# Patient Record
Sex: Female | Born: 1960 | Race: Black or African American | Hispanic: No | Marital: Single | Smoking: Current every day smoker
Health system: Southern US, Community
[De-identification: ages and names within clinical notes are randomized; demographics above are authoritative.]

## PROBLEM LIST (undated history)

## (undated) DIAGNOSIS — M5020 Other cervical disc displacement, unspecified cervical region: Secondary | ICD-10-CM

## (undated) DIAGNOSIS — I10 Essential (primary) hypertension: Secondary | ICD-10-CM

## (undated) DIAGNOSIS — E119 Type 2 diabetes mellitus without complications: Secondary | ICD-10-CM

## (undated) DIAGNOSIS — H409 Unspecified glaucoma: Secondary | ICD-10-CM

## (undated) DIAGNOSIS — J45909 Unspecified asthma, uncomplicated: Secondary | ICD-10-CM

## (undated) DIAGNOSIS — F419 Anxiety disorder, unspecified: Secondary | ICD-10-CM

## (undated) HISTORY — PX: PARTIAL HYSTERECTOMY: SHX80

## (undated) HISTORY — PX: BILATERAL CARPAL TUNNEL RELEASE: SHX6508

## (undated) HISTORY — PX: NASAL SINUS SURGERY: SHX719

## (undated) HISTORY — PX: BLADDER SUSPENSION: SHX72

---

## 1998-08-08 ENCOUNTER — Encounter: Payer: Self-pay | Admitting: Emergency Medicine

## 1998-08-08 ENCOUNTER — Emergency Department (HOSPITAL_COMMUNITY): Admission: EM | Admit: 1998-08-08 | Discharge: 1998-08-08 | Payer: Self-pay | Admitting: Emergency Medicine

## 1999-02-13 ENCOUNTER — Emergency Department (HOSPITAL_COMMUNITY): Admission: EM | Admit: 1999-02-13 | Discharge: 1999-02-13 | Payer: Self-pay | Admitting: Emergency Medicine

## 1999-02-13 ENCOUNTER — Encounter: Payer: Self-pay | Admitting: Emergency Medicine

## 2014-01-31 ENCOUNTER — Encounter (INDEPENDENT_AMBULATORY_CARE_PROVIDER_SITE_OTHER): Payer: PRIVATE HEALTH INSURANCE | Admitting: Ophthalmology

## 2014-01-31 DIAGNOSIS — H2510 Age-related nuclear cataract, unspecified eye: Secondary | ICD-10-CM

## 2014-01-31 DIAGNOSIS — I10 Essential (primary) hypertension: Secondary | ICD-10-CM

## 2014-01-31 DIAGNOSIS — H43819 Vitreous degeneration, unspecified eye: Secondary | ICD-10-CM

## 2014-01-31 DIAGNOSIS — H35419 Lattice degeneration of retina, unspecified eye: Secondary | ICD-10-CM

## 2014-01-31 DIAGNOSIS — H35039 Hypertensive retinopathy, unspecified eye: Secondary | ICD-10-CM

## 2016-08-24 ENCOUNTER — Emergency Department (HOSPITAL_COMMUNITY)
Admission: EM | Admit: 2016-08-24 | Discharge: 2016-08-24 | Disposition: A | Discharge status: Home / Self Care | Payer: Medicare Other | Attending: Emergency Medicine | Admitting: Emergency Medicine

## 2016-08-24 ENCOUNTER — Encounter (HOSPITAL_COMMUNITY): Payer: Self-pay | Admitting: Emergency Medicine

## 2016-08-24 DIAGNOSIS — M5440 Lumbago with sciatica, unspecified side: Secondary | ICD-10-CM

## 2016-08-24 DIAGNOSIS — G8929 Other chronic pain: Secondary | ICD-10-CM | POA: Diagnosis not present

## 2016-08-24 DIAGNOSIS — Z87891 Personal history of nicotine dependence: Secondary | ICD-10-CM | POA: Diagnosis not present

## 2016-08-24 DIAGNOSIS — M545 Low back pain: Secondary | ICD-10-CM | POA: Diagnosis present

## 2016-08-24 HISTORY — DX: Anxiety disorder, unspecified: F41.9

## 2016-08-24 HISTORY — DX: Unspecified glaucoma: H40.9

## 2016-08-24 MED ORDER — LIDOCAINE 5 % EX PTCH: 1.0000 | MEDICATED_PATCH | CUTANEOUS | 0 refills | Status: DC

## 2016-08-24 MED ORDER — CYCLOBENZAPRINE HCL 10 MG PO TABS: 10.0000 mg | ORAL_TABLET | Freq: Two times a day (BID) | ORAL | 0 refills | Status: DC | PRN

## 2016-08-24 MED ORDER — OXYCODONE-ACETAMINOPHEN 5-325 MG PO TABS: 2.0000 | ORAL_TABLET | Freq: Once | ORAL | Status: DC

## 2016-08-24 MED ORDER — HYDROMORPHONE HCL 1 MG/ML IJ SOLN
1.0000 mg | Freq: Once | INTRAMUSCULAR | Status: AC
  Administered 2016-08-24: 1 mg via INTRAVENOUS
  Filled 2016-08-24: qty 1

## 2016-08-24 MED ORDER — PREDNISONE 10 MG PO TABS: 20.0000 mg | ORAL_TABLET | Freq: Two times a day (BID) | ORAL | 0 refills | Status: DC

## 2016-08-24 MED ORDER — KETOROLAC TROMETHAMINE 60 MG/2ML IM SOLN: 60.0000 mg | Freq: Once | INTRAMUSCULAR | Status: DC

## 2016-08-24 MED ORDER — KETOROLAC TROMETHAMINE 30 MG/ML IJ SOLN
30.0000 mg | Freq: Once | INTRAMUSCULAR | Status: AC
  Administered 2016-08-24: 30 mg via INTRAVENOUS
  Filled 2016-08-24: qty 1

## 2016-08-24 MED ORDER — OXYCODONE-ACETAMINOPHEN 5-325 MG PO TABS: 1.0000 | ORAL_TABLET | Freq: Four times a day (QID) | ORAL | 0 refills | Status: DC | PRN

## 2016-08-24 MED ORDER — METHOCARBAMOL 500 MG PO TABS
1000.0000 mg | ORAL_TABLET | Freq: Once | ORAL | Status: AC
  Administered 2016-08-24: 1000 mg via ORAL
  Filled 2016-08-24: qty 2

## 2016-08-24 NOTE — ED Notes (Signed)
Pt. Walked tot he bathroom and is able to ambulate

## 2016-08-24 NOTE — ED Notes (Signed)
Papers reviewed, IV removed and pt verbalizes understanding Leaving ambulatory with husband with decreased pain

## 2016-08-24 NOTE — ED Provider Notes (Signed)
[facility]PT Provider Note   CSN: [id number] Arrival date & time: 08/24/16  0940     History   Chief Complaint Chief Complaint  Patient presents with  . Back Pain    HPI Sara Juarez is a 55 y.o. female.   HPI   Sara Juarez is a 55 y.o. female, with a history of Chronic back pain and L4-L5 herniated disc, presenting to the ED with acute on chronic lower back pain worsening over the past 2-3 days. Pain is severe, shooting down both legs. Endorses worsening numbness and weakness bilaterally in the lower extremities. Pt was using hydrocodone/APAP 10/325mg  every 4 hours for her back pain up until 3 weeks ago. States she has been seen by a neurosurgeon for her back injury, but was told that the surgery would be risky and they couldn't guarantee she would be able to walk afterward.  Denies fever/chills, changes in bowel or bladder function, nausea/vomiting, abdominal pain, falls/trauma, or any other complaints. Patient also denies history of HIV, IV drug use, or epidural procedures.     Past Medical History:  Diagnosis Date  . Anxiety   . Glaucoma (increased eye pressure)     There are no active problems to display for this patient.   History reviewed. No pertinent surgical history.  OB History    No data available       Home Medications    Prior to Admission medications   Medication Sig Start Date End Date Taking? Authorizing Provider  ALPRAZolam [name]) 0.5 MG tablet Take 0.5 mg by mouth 2 (two) times daily as needed for anxiety. 08/07/16  Yes Historical Provider, MD  fluticasone (FLONASE) 50 MCG/ACT nasal spray Place 1 spray into both nostrils daily as needed for allergies. 06/16/16  Yes Historical Provider, MD  HYDROcodone-acetaminophen (NORCO) 10-325 MG tablet Take 1 tablet by mouth every 6 (six) hours as needed for pain. 07/08/16  Yes Historical Provider, MD  lisinopril (PRINIVIL,ZESTRIL) 10 MG tablet Take 10 mg by mouth daily. 08/07/16  Yes  Historical Provider, MD  loratadine (CLARITIN) 10 MG tablet Take 10 mg by mouth daily. 06/03/16  Yes Historical Provider, MD  LUMIGAN 0.01 % SOLN Place 1 drop into both eyes every evening. 06/04/16  Yes Historical Provider, MD  omeprazole (PRILOSEC) 20 MG capsule Take 20 mg by mouth daily as needed for allergies. 07/08/16  Yes Historical Provider, MD  PAZEO 0.7 % SOLN Place 1 drop into both eyes daily. 06/16/16  Yes Historical Provider, MD  Polyethyl Glycol-Propyl Glycol (SYSTANE OP) Place 1 drop into both eyes daily as needed (dry eyes).   Yes Historical Provider, MD  PROAIR HFA 108 [phone] Base) MCG/ACT inhaler Inhale 1 puff into the lungs every 6 (six) hours as needed for wheezing. 06/08/16  Yes Historical Provider, MD  tiZANidine (ZANAFLEX) 4 MG tablet Take 4 mg by mouth 3 (three) times daily as needed for muscle spasms. 07/12/16  Yes Historical Provider, MD  zafirlukast (ACCOLATE) 20 MG tablet Take 20 mg by mouth every evening. 06/16/16  Yes Historical Provider, MD  cyclobenzaprine (FLEXERIL) 10 MG tablet Take 1 tablet (10 mg total) by mouth 2 (two) times daily as needed for muscle spasms. 08/24/16   Yennifer Segovia C Chelse Matas, PA-C  lidocaine (LIDODERM) 5 % Place 1 patch onto the skin daily. Remove & Discard patch within 12 hours or as directed by MD 08/24/16   Anselm Pancoast, PA-C  oxyCODONE-acetaminophen (PERCOCET/ROXICET) 5-325 MG tablet Take 1-2 tablets by mouth every 6 (six)  hours as needed for severe pain. 08/24/16   Philmore Lepore C Alyanah Elliott, PA-C  predniSONE (DELTASONE) 10 MG tablet Take 2 tablets (20 mg total) by mouth 2 (two) times daily with a meal. 08/24/16   Randi Poullard C Tamikka Pilger, PA-C    Family History No family history on file.  Social History Social History  Substance Use Topics  . Smoking status: Former Games developermoker  . Smokeless tobacco: Never Used  . Alcohol use Not on file     Allergies   Erythromycin and Aspirin   Review of Systems Review of Systems  Constitutional: Negative for chills and fever.  Musculoskeletal: Positive  for back pain.  Neurological: Positive for numbness (chronic). Negative for weakness.  All other systems reviewed and are negative.    Physical Exam Updated Vital Signs BP 145/78 (BP Location: Right Arm)   Pulse 86   Temp 98.4 F (36.9 C) (Oral)   Resp 20   SpO2 100%   Physical Exam  Constitutional: She is oriented to person, place, and time. She appears well-developed and well-nourished. No distress.  HENT:  Head: Normocephalic and atraumatic.  Eyes: Conjunctivae are normal.  Neck: Neck supple.  Cardiovascular: Normal rate, regular rhythm, normal heart sounds and intact distal pulses.   Pulmonary/Chest: Effort normal and breath sounds normal. No respiratory distress.  Abdominal: Soft. There is no tenderness. There is no guarding.  Musculoskeletal: She exhibits no edema or tenderness.  Midline lumbar spinal tenderness without step off or deformity. Full ROM in all extremities and spine. No other midline spinal tenderness.   Lymphadenopathy:    She has no cervical adenopathy.  Neurological: She is alert and oriented to person, place, and time. She has normal reflexes.  Chronic decreased sensation in the left foot. Strength initially rated 4 out of 5 bilaterally in the lower extremities, however, after the patient's pain was controlled, her strength is noted to be 5 out of 5 bilaterally in both the upper and lower extremities. Coordination intact. No gait deficit.  Skin: Skin is warm and dry. She is not diaphoretic.  Psychiatric: She has a normal mood and affect. Her behavior is normal.  Nursing note and vitals reviewed.    ED Treatments / Results  Labs (all labs ordered are listed, but only abnormal results are displayed) Labs Reviewed - No data to display  EKG  EKG Interpretation None       Radiology No results found.  Procedures Procedures (including critical care time)  Angiocath insertion Performed by: Anselm PancoastShawn C Laconda Basich  Consent: Verbal consent obtained. Risks  and benefits: risks, benefits and alternatives were discussed Time out: Immediately prior to procedure a "time out" was called to verify the correct patient, procedure, equipment, support staff and site/side marked as required.  Preparation: Patient was prepped and draped in the usual sterile fashion.  Vein Location: Right forearm  Not Ultrasound Guided  Gauge: 20-gauge  Normal blood return and flush without difficulty Patient tolerance: Patient tolerated the procedure well with no immediate complications.    Medications Ordered in ED Medications  methocarbamol (ROBAXIN) tablet 1,000 mg (1,000 mg Oral Given 08/24/16 1023)  HYDROmorphone (DILAUDID) injection 1 mg (1 mg Intravenous Given 08/24/16 1024)  ketorolac (TORADOL) 30 MG/ML injection 30 mg (30 mg Intravenous Given 08/24/16 1024)     Initial Impression / Assessment and Plan / ED Course  I have reviewed the triage vital signs and the nursing notes.  Pertinent labs & imaging results that were available during my care of the patient were  reviewed by me and considered in my medical decision making (see chart for details).  Clinical Course    Marlaysia A Smola presents with midline lower back pain radiating down the legs worsening over the last 3 days.  Findings and plan of care discussed with Melene Plan, DO.   Patient was some decreased function, likely due to pain. This was proven once the patient's pain was well controlled. Patient is ambulatory without assistance and able to urinate without difficulty. Neurosurgery follow-up. The patient was given instructions for home care as well as return precautions. Patient voices understanding of these instructions, accepts the plan, and is comfortable with discharge.   Vitals:   08/24/16 1115 08/24/16 1130 08/24/16 1145 08/24/16 1200  BP: 125/59 133/86 143/76 149/96  Pulse: 71 69 71 70  Resp:      Temp:      TempSrc:      SpO2: 96% 96% 94% 95%     Final Clinical Impressions(s)  / ED Diagnoses   Final diagnoses:  Midline low back pain with sciatica, sciatica laterality unspecified    New Prescriptions New Prescriptions   CYCLOBENZAPRINE (FLEXERIL) 10 MG TABLET    Take 1 tablet (10 mg total) by mouth 2 (two) times daily as needed for muscle spasms.   LIDOCAINE (LIDODERM) 5 %    Place 1 patch onto the skin daily. Remove & Discard patch within 12 hours or as directed by MD   OXYCODONE-ACETAMINOPHEN (PERCOCET/ROXICET) 5-325 MG TABLET    Take 1-2 tablets by mouth every 6 (six) hours as needed for severe pain.   PREDNISONE (DELTASONE) 10 MG TABLET    Take 2 tablets (20 mg total) by mouth 2 (two) times daily with a meal.     Anselm Pancoast, PA-C 08/24/16 1228    Melene Plan, DO 08/24/16 1229

## 2016-08-24 NOTE — ED Triage Notes (Signed)
Back pain for a long time and this am pt states it got so bad that ir shoots down both legs sharp pain no numbness or tingling, pt tearful states hurts to move

## 2016-08-24 NOTE — Discharge Instructions (Signed)
Take it easy, but do not lay around too much as this may make the stiffness worse. Take 500 mg of naproxen every 12 hours or 800 mg of ibuprofen every 8 hours for the next 3 days. Take these medications with food to avoid upset stomach. Flexeril is a muscle relaxer and may help loosen stiff muscles. Percocet for severe pain. Do not take the Flexeril or Percocet while driving or performing other dangerous activities.  Follow-up with the neurosurgeon as soon as possible. Call the number provided to set up an appointment.

## 2016-08-24 NOTE — ED Notes (Addendum)
L4, L5 herniated and bulging L shoulder blade has pinched nerve  Abnormal deteriation  in lower lumbar spine   Prescribed hydrocodone for pain at home

## 2016-09-07 ENCOUNTER — Emergency Department (HOSPITAL_COMMUNITY)
Admission: EM | Admit: 2016-09-07 | Discharge: 2016-09-07 | Disposition: A | Discharge status: Home / Self Care | Payer: Medicare Other | Attending: Emergency Medicine | Admitting: Emergency Medicine

## 2016-09-07 ENCOUNTER — Encounter (HOSPITAL_COMMUNITY): Payer: Self-pay | Admitting: Emergency Medicine

## 2016-09-07 DIAGNOSIS — Z87891 Personal history of nicotine dependence: Secondary | ICD-10-CM | POA: Insufficient documentation

## 2016-09-07 DIAGNOSIS — M62838 Other muscle spasm: Secondary | ICD-10-CM | POA: Diagnosis present

## 2016-09-07 DIAGNOSIS — M62830 Muscle spasm of back: Secondary | ICD-10-CM | POA: Diagnosis not present

## 2016-09-07 HISTORY — DX: Other cervical disc displacement, unspecified cervical region: M50.20

## 2016-09-07 MED ORDER — CYCLOBENZAPRINE HCL 10 MG PO TABS: 10.0000 mg | ORAL_TABLET | Freq: Two times a day (BID) | ORAL | 0 refills | Status: DC | PRN

## 2016-09-07 MED ORDER — DICLOFENAC SODIUM 1 % TD GEL: 2.0000 g | Freq: Four times a day (QID) | TRANSDERMAL | 0 refills | Status: DC

## 2016-09-07 NOTE — ED Triage Notes (Signed)
States she was getting stuff out of storage unit and has had lots of hand/wrist surgeries and overused them. Having spasms in back, leg, hands; would usually take flexeril and would feel better but out of them.

## 2016-09-07 NOTE — ED Notes (Signed)
Patient ambulated to restroom without difficulty.

## 2016-09-07 NOTE — ED Notes (Signed)
C/o "pain all over...for a while" States she is out of Flexeril. Also, c/o right wrist pain since "getting in storage building" this am.

## 2016-09-07 NOTE — ED Provider Notes (Signed)
[facility] Provider Note   CSN: [id number] Arrival date & time: 09/07/16  1233   By signing my name below, I, Freida Busmaniana Omoyeni, attest that this documentation has been prepared under the direction and in the presence of non-physician practitioner, Gaylyn RongSamantha Dowless, PA-C. Electronically Signed: Freida Busmaniana Omoyeni, Scribe. 09/07/2016. 2:09 PM.   History   Chief Complaint Chief Complaint  Patient presents with  . Spasms   The history is provided by the patient. No language interpreter was used.     HPI Comments:  Sara Juarez is a 55 y.o. female who presents to the Emergency Department complaining of muscle spasms in her BUE, back, and inner thighs bilaterally. Pt notes h/o same secondary to pinched nerve in back but states she is usually able to alleviate her pain by taking Flexeril which she ran out of  2 days ago. Pt notes associated tingling in her hands today. She has been able to ambulate. She denies bowel/bladder incontinence. She has not yet been able to follow up with neurosurgery.  Past Medical History:  Diagnosis Date  . Anxiety   . Cervical herniated disc   . Glaucoma (increased eye pressure)     There are no active problems to display for this patient.   No past surgical history on file.  OB History    No data available       Home Medications    Prior to Admission medications   Medication Sig Start Date End Date Taking? Authorizing Provider  ALPRAZolam [name](XANAX) 0.5 MG tablet Take 0.5 mg by mouth 2 (two) times daily as needed for anxiety. 08/07/16   Historical Provider, MD  cyclobenzaprine (FLEXERIL) 10 MG tablet Take 1 tablet (10 mg total) by mouth 2 (two) times daily as needed for muscle spasms. 08/24/16   [name], PA-C  fluticasone (FLONASE) 50 MCG/ACT nasal spray Place 1 spray into both nostrils daily as needed for allergies. 06/16/16   Historical Provider, MD  HYDROcodone-acetaminophen (NORCO) 10-325 MG tablet Take 1 tablet by mouth every 6 (six)  hours as needed for pain. 07/08/16   Historical Provider, MD  lidocaine (LIDODERM) 5 % Place 1 patch onto the skin daily. Remove & Discard patch within 12 hours or as directed by MD 08/24/16   Hillard DankerShawn C Joy, PA-C  lisinopril (PRINIVIL,ZESTRIL) 10 MG tablet Take 10 mg by mouth daily. 08/07/16   Historical Provider, MD  loratadine (CLARITIN) 10 MG tablet Take 10 mg by mouth daily. 06/03/16   Historical Provider, MD  LUMIGAN 0.01 % SOLN Place 1 drop into both eyes every evening. 06/04/16   Historical Provider, MD  omeprazole (PRILOSEC) 20 MG capsule Take 20 mg by mouth daily as needed for allergies. 07/08/16   Historical Provider, MD  oxyCODONE-acetaminophen (PERCOCET/ROXICET) 5-325 MG tablet Take 1-2 tablets by mouth every 6 (six) hours as needed for severe pain. 08/24/16   [name], PA-C  PAZEO 0.7 % SOLN Place 1 drop into both eyes daily. 06/16/16   Historical Provider, MD  Polyethyl Glycol-Propyl Glycol (SYSTANE OP) Place 1 drop into both eyes daily as needed (dry eyes).    Historical Provider, MD  predniSONE (DELTASONE) 10 MG tablet Take 2 tablets (20 mg total) by mouth 2 (two) times daily with a meal. 08/24/16   [name], PA-C  PROAIR HFA 108 (90 Base) MCG/ACT inhaler Inhale 1 puff into the lungs every 6 (six) hours as needed for wheezing. 06/08/16   Historical Provider, MD  tiZANidine (ZANAFLEX) 4 MG tablet  Take 4 mg by mouth 3 (three) times daily as needed for muscle spasms. 07/12/16   Historical Provider, MD  zafirlukast (ACCOLATE) 20 MG tablet Take 20 mg by mouth every evening. 06/16/16   Historical Provider, MD    Family History No family history on file.  Social History Social History  Substance Use Topics  . Smoking status: Former Games developer  . Smokeless tobacco: Never Used  . Alcohol use Not on file     Allergies   Erythromycin and Aspirin   Review of Systems Review of Systems 10 systems reviewed and all are negative for acute change except as noted in the HPI.   Physical  Exam Updated Vital Signs BP (!) 140/101 (BP Location: Right Arm)   Pulse 98   Temp 98.6 F (37 C) (Oral)   Resp 22   SpO2 96%   Physical Exam  Constitutional: She is oriented to person, place, and time. She appears well-developed and well-nourished. No distress.  HENT:  Head: Normocephalic and atraumatic.  Eyes: Conjunctivae are normal. Right eye exhibits no discharge. Left eye exhibits no discharge. No scleral icterus.  Cardiovascular: Normal rate.   Pulmonary/Chest: Effort normal.  Musculoskeletal:       Thoracic back: She exhibits spasm. She exhibits no bony tenderness and no deformity.       Back:  Neurological: She is alert and oriented to person, place, and time. Coordination normal.  Skin: Skin is warm and dry. No rash noted. She is not diaphoretic. No erythema. No pallor.  Psychiatric: She has a normal mood and affect. Her behavior is normal.  Nursing note and vitals reviewed.    ED Treatments / Results  DIAGNOSTIC STUDIES:  Oxygen Saturation is 96% on RA, normal by my interpretation.    COORDINATION OF CARE:  2:13 PM Discussed treatment plan with pt at bedside and pt agreed to plan.  Labs (all labs ordered are listed, but only abnormal results are displayed) Labs Reviewed - No data to display  EKG  EKG Interpretation None       Radiology No results found.  Procedures Procedures (including critical care time)  Medications Ordered in ED Medications - No data to display   Initial Impression / Assessment and Plan / ED Course  I have reviewed the triage vital signs and the nursing notes.  Pertinent labs & imaging results that were available during my care of the patient were reviewed by me and considered in my medical decision making (see chart for details).  Clinical Course    55 year old female with history of chronic back pain, "pinched nerve in back", presents to the ED today with complaint of ongoing muscle spasms. Patient has been having  similar symptoms for many years and is typically controlled with Flexeril. However, patient ran out of her medication 2 days ago. No neurological deficits on exam. Patient is ambulatory in ED without difficulty. Patient has scheduled follow-up with neurosurgery. We'll provide refill her Flexeril and Voltaren gel. Return precautions outlined in patient discharge instructions.  Final Clinical Impressions(s) / ED Diagnoses   Final diagnoses:  Muscle spasm of back    New Prescriptions Discharge Medication List as of 09/07/2016  2:30 PM    START taking these medications   Details  !! cyclobenzaprine (FLEXERIL) 10 MG tablet Take 1 tablet (10 mg total) by mouth 2 (two) times daily as needed for muscle spasms., Starting Tue 09/07/2016, Print    diclofenac sodium (VOLTAREN) 1 % GEL Apply 2 g topically 4 (four)  times daily., Starting Tue 09/07/2016, Print     !! - Potential duplicate medications found. Please discuss with provider.     I personally performed the services described in this documentation, which was scribed in my presence. The recorded information has been reviewed and is accurate.      Lester Kinsman [place], PA-C 09/07/16 1627    Cathren Laine, MD 09/08/16 1340

## 2016-09-28 ENCOUNTER — Emergency Department (HOSPITAL_COMMUNITY)
Admission: EM | Admit: 2016-09-28 | Discharge: 2016-09-28 | Disposition: A | Discharge status: Home / Self Care | Payer: Medicare Other | Attending: Emergency Medicine | Admitting: Emergency Medicine

## 2016-09-28 ENCOUNTER — Emergency Department (HOSPITAL_COMMUNITY): Payer: Medicare Other

## 2016-09-28 ENCOUNTER — Encounter (HOSPITAL_COMMUNITY): Payer: Self-pay | Admitting: Emergency Medicine

## 2016-09-28 DIAGNOSIS — S6991XA Unspecified injury of right wrist, hand and finger(s), initial encounter: Secondary | ICD-10-CM | POA: Diagnosis present

## 2016-09-28 DIAGNOSIS — S6391XA Sprain of unspecified part of right wrist and hand, initial encounter: Secondary | ICD-10-CM | POA: Insufficient documentation

## 2016-09-28 DIAGNOSIS — F17200 Nicotine dependence, unspecified, uncomplicated: Secondary | ICD-10-CM | POA: Diagnosis not present

## 2016-09-28 DIAGNOSIS — Y939 Activity, unspecified: Secondary | ICD-10-CM | POA: Diagnosis not present

## 2016-09-28 DIAGNOSIS — X58XXXA Exposure to other specified factors, initial encounter: Secondary | ICD-10-CM | POA: Insufficient documentation

## 2016-09-28 DIAGNOSIS — S63501A Unspecified sprain of right wrist, initial encounter: Secondary | ICD-10-CM

## 2016-09-28 DIAGNOSIS — Y999 Unspecified external cause status: Secondary | ICD-10-CM | POA: Insufficient documentation

## 2016-09-28 DIAGNOSIS — Y929 Unspecified place or not applicable: Secondary | ICD-10-CM | POA: Diagnosis not present

## 2016-09-28 NOTE — ED Notes (Signed)
Pt called several times with no answer 
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
## 2016-09-28 NOTE — ED Notes (Signed)
See providers assessment.  

## 2016-09-28 NOTE — ED Notes (Signed)
Patient never returned to the ED nor answered any of the calls for their name.  Patient moved off the floor at this time
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
## 2016-09-28 NOTE — ED Notes (Signed)
Patient walked back to the desk and she will be seen now

## 2017-01-05 ENCOUNTER — Emergency Department (HOSPITAL_COMMUNITY)
Admission: EM | Admit: 2017-01-05 | Discharge: 2017-01-05 | Disposition: A | Discharge status: Home / Self Care | Payer: Medicare Other | Attending: Emergency Medicine | Admitting: Emergency Medicine

## 2017-01-05 ENCOUNTER — Emergency Department (HOSPITAL_COMMUNITY): Payer: Medicare Other

## 2017-01-05 ENCOUNTER — Encounter (HOSPITAL_COMMUNITY): Payer: Self-pay

## 2017-01-05 DIAGNOSIS — Z79899 Other long term (current) drug therapy: Secondary | ICD-10-CM | POA: Diagnosis not present

## 2017-01-05 DIAGNOSIS — F17200 Nicotine dependence, unspecified, uncomplicated: Secondary | ICD-10-CM | POA: Insufficient documentation

## 2017-01-05 DIAGNOSIS — Y999 Unspecified external cause status: Secondary | ICD-10-CM | POA: Diagnosis not present

## 2017-01-05 DIAGNOSIS — Y939 Activity, unspecified: Secondary | ICD-10-CM | POA: Diagnosis not present

## 2017-01-05 DIAGNOSIS — G894 Chronic pain syndrome: Secondary | ICD-10-CM | POA: Diagnosis not present

## 2017-01-05 DIAGNOSIS — S161XXA Strain of muscle, fascia and tendon at neck level, initial encounter: Secondary | ICD-10-CM | POA: Insufficient documentation

## 2017-01-05 DIAGNOSIS — Y92410 Unspecified street and highway as the place of occurrence of the external cause: Secondary | ICD-10-CM | POA: Diagnosis not present

## 2017-01-05 DIAGNOSIS — S199XXA Unspecified injury of neck, initial encounter: Secondary | ICD-10-CM | POA: Diagnosis present

## 2017-01-05 MED ORDER — HYDROCODONE-ACETAMINOPHEN 5-325 MG PO TABS
2.0000 | ORAL_TABLET | Freq: Once | ORAL | Status: AC
  Administered 2017-01-05: 2 via ORAL
  Filled 2017-01-05: qty 2

## 2017-01-05 MED ORDER — CYCLOBENZAPRINE HCL 10 MG PO TABS
10.0000 mg | ORAL_TABLET | Freq: Once | ORAL | Status: AC
  Administered 2017-01-05: 10 mg via ORAL
  Filled 2017-01-05: qty 1

## 2017-01-05 NOTE — ED Notes (Addendum)
Verbalized understanding discharge instructions and follow-up. In no acute distress.  Pt informed to take home medications as prescribed.
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
## 2017-01-05 NOTE — Discharge Instructions (Signed)
Take muscle relaxer at bedtime to help you sleep. This medicine makes you drowsy so do not take before driving or work Continue your home pain medicine regimen and follow up with Dr. Clovis RileyMitchell as needed Use a heating pad for sore muscles - use for 20 minutes several times a day

## 2017-02-21 ENCOUNTER — Emergency Department (HOSPITAL_COMMUNITY): Payer: Medicare Other

## 2017-02-21 ENCOUNTER — Inpatient Hospital Stay (HOSPITAL_COMMUNITY)
Admission: EM | Admit: 2017-02-21 | Discharge: 2017-02-23 | DRG: 872 | Disposition: A | Discharge status: Home / Self Care | Payer: Medicare Other | Attending: Internal Medicine | Admitting: Internal Medicine

## 2017-02-21 ENCOUNTER — Encounter (HOSPITAL_COMMUNITY): Payer: Self-pay | Admitting: *Deleted

## 2017-02-21 DIAGNOSIS — I10 Essential (primary) hypertension: Secondary | ICD-10-CM

## 2017-02-21 DIAGNOSIS — Z7984 Long term (current) use of oral hypoglycemic drugs: Secondary | ICD-10-CM

## 2017-02-21 DIAGNOSIS — N898 Other specified noninflammatory disorders of vagina: Secondary | ICD-10-CM | POA: Diagnosis not present

## 2017-02-21 DIAGNOSIS — R102 Pelvic and perineal pain: Secondary | ICD-10-CM | POA: Diagnosis not present

## 2017-02-21 DIAGNOSIS — E1159 Type 2 diabetes mellitus with other circulatory complications: Secondary | ICD-10-CM | POA: Diagnosis present

## 2017-02-21 DIAGNOSIS — H409 Unspecified glaucoma: Secondary | ICD-10-CM | POA: Diagnosis present

## 2017-02-21 DIAGNOSIS — Z7951 Long term (current) use of inhaled steroids: Secondary | ICD-10-CM

## 2017-02-21 DIAGNOSIS — R000 Tachycardia, unspecified: Secondary | ICD-10-CM | POA: Diagnosis present

## 2017-02-21 DIAGNOSIS — A419 Sepsis, unspecified organism: Principal | ICD-10-CM | POA: Diagnosis present

## 2017-02-21 DIAGNOSIS — E119 Type 2 diabetes mellitus without complications: Secondary | ICD-10-CM | POA: Diagnosis not present

## 2017-02-21 DIAGNOSIS — N12 Tubulo-interstitial nephritis, not specified as acute or chronic: Secondary | ICD-10-CM | POA: Diagnosis present

## 2017-02-21 DIAGNOSIS — B9620 Unspecified Escherichia coli [E. coli] as the cause of diseases classified elsewhere: Secondary | ICD-10-CM | POA: Diagnosis present

## 2017-02-21 DIAGNOSIS — Z6835 Body mass index (BMI) 35.0-35.9, adult: Secondary | ICD-10-CM

## 2017-02-21 DIAGNOSIS — Z79899 Other long term (current) drug therapy: Secondary | ICD-10-CM

## 2017-02-21 DIAGNOSIS — Z888 Allergy status to other drugs, medicaments and biological substances status: Secondary | ICD-10-CM

## 2017-02-21 DIAGNOSIS — F17200 Nicotine dependence, unspecified, uncomplicated: Secondary | ICD-10-CM | POA: Diagnosis present

## 2017-02-21 HISTORY — DX: Essential (primary) hypertension: I10

## 2017-02-21 HISTORY — DX: Tubulo-interstitial nephritis, not specified as acute or chronic: N12

## 2017-02-21 HISTORY — DX: Type 2 diabetes mellitus without complications: E11.9

## 2017-02-21 LAB — URINALYSIS, ROUTINE W REFLEX MICROSCOPIC
BILIRUBIN URINE: NEGATIVE
Glucose, UA: >=500 mg/dL — AB
Ketones, ur: NEGATIVE mg/dL
NITRITE: NEGATIVE
PROTEIN: 100 mg/dL — AB
SPECIFIC GRAVITY, URINE: 1.023 (ref 1.005–1.030)
pH: 6 (ref 5.0–8.0)

## 2017-02-21 LAB — COMPREHENSIVE METABOLIC PANEL
ALBUMIN: 3.9 g/dL (ref 3.5–5.0)
ALK PHOS: 68 U/L (ref 38–126)
ALT: 23 U/L (ref 14–54)
ANION GAP: 12 (ref 5–15)
AST: 19 U/L (ref 15–41)
BUN: 9 mg/dL (ref 6–20)
CALCIUM: 9.6 mg/dL (ref 8.9–10.3)
CHLORIDE: 96 mmol/L — AB (ref 101–111)
CO2: 26 mmol/L (ref 22–32)
CREATININE: 0.87 mg/dL (ref 0.44–1.00)
GFR calc Af Amer: >60 mL/min (ref >60)
GFR calc non Af Amer: >60 mL/min (ref >60)
GLUCOSE: 384 mg/dL — AB (ref 65–99)
Potassium: 4.1 mmol/L (ref 3.5–5.1)
SODIUM: 134 mmol/L — AB (ref 135–145)
Total Bilirubin: 0.7 mg/dL (ref 0.3–1.2)
Total Protein: 8.1 g/dL (ref 6.5–8.1)

## 2017-02-21 LAB — CBC WITH DIFFERENTIAL/PLATELET
BASOS ABS: 0 10*3/uL (ref 0.0–0.1)
Basophils Relative: 0 %
EOS PCT: 1 %
Eosinophils Absolute: 0.1 10*3/uL (ref 0.0–0.7)
HCT: 46.7 % — ABNORMAL HIGH (ref 36.0–46.0)
HEMOGLOBIN: 15.9 g/dL — AB (ref 12.0–15.0)
LYMPHS PCT: 23 %
Lymphs Abs: 2.6 10*3/uL (ref 0.7–4.0)
MCH: 28.8 pg (ref 26.0–34.0)
MCHC: 34 g/dL (ref 30.0–36.0)
MCV: 84.6 fL (ref 78.0–100.0)
MONOS PCT: 9 %
Monocytes Absolute: 1 10*3/uL (ref 0.1–1.0)
NEUTROS PCT: 67 %
Neutro Abs: 7.8 10*3/uL — ABNORMAL HIGH (ref 1.7–7.7)
Platelets: 312 10*3/uL (ref 150–400)
RBC: 5.52 MIL/uL — AB (ref 3.87–5.11)
RDW: 13.2 % (ref 11.5–15.5)
WBC: 11.5 10*3/uL — AB (ref 4.0–10.5)

## 2017-02-21 LAB — WET PREP, GENITAL
Clue Cells Wet Prep HPF POC: NONE SEEN
SPERM: NONE SEEN
TRICH WET PREP: NONE SEEN
YEAST WET PREP: NONE SEEN

## 2017-02-21 LAB — RAPID HIV SCREEN (HIV 1/2 AB+AG)
HIV 1/2 Antibodies: NONREACTIVE
HIV-1 P24 Antigen - HIV24: NONREACTIVE

## 2017-02-21 LAB — I-STAT CG4 LACTIC ACID, ED
LACTIC ACID, VENOUS: 2.9 mmol/L — AB (ref 0.5–1.9)
Lactic Acid, Venous: 2.76 mmol/L (ref 0.5–1.9)

## 2017-02-21 MED ORDER — PIPERACILLIN-TAZOBACTAM 3.375 G IVPB 30 MIN
3.3750 g | Freq: Once | INTRAVENOUS | Status: AC
  Administered 2017-02-21: 3.375 g via INTRAVENOUS
  Filled 2017-02-21: qty 50

## 2017-02-21 MED ORDER — INSULIN ASPART 100 UNIT/ML ~~LOC~~ SOLN
0.0000 [IU] | Freq: Three times a day (TID) | SUBCUTANEOUS | Status: DC
  Administered 2017-02-22 (×2): 8 [IU] via SUBCUTANEOUS
  Administered 2017-02-22: 11 [IU] via SUBCUTANEOUS
  Administered 2017-02-23: 15 [IU] via SUBCUTANEOUS

## 2017-02-21 MED ORDER — SODIUM CHLORIDE 0.9 % IV BOLUS (SEPSIS)
1000.0000 mL | Freq: Once | INTRAVENOUS | Status: AC
  Administered 2017-02-21: 1000 mL via INTRAVENOUS

## 2017-02-21 MED ORDER — ACETAMINOPHEN 325 MG PO TABS
650.0000 mg | ORAL_TABLET | Freq: Once | ORAL | Status: AC
  Administered 2017-02-21: 650 mg via ORAL

## 2017-02-21 MED ORDER — ENOXAPARIN SODIUM 40 MG/0.4ML ~~LOC~~ SOLN
40.0000 mg | Freq: Every day | SUBCUTANEOUS | Status: DC
  Administered 2017-02-22 – 2017-02-23 (×2): 40 mg via SUBCUTANEOUS
  Filled 2017-02-21 (×2): qty 0.4

## 2017-02-21 MED ORDER — ALPRAZOLAM 0.25 MG PO TABS
0.2500 mg | ORAL_TABLET | Freq: Two times a day (BID) | ORAL | Status: DC | PRN
  Administered 2017-02-22: 0.5 mg via ORAL
  Filled 2017-02-21 (×2): qty 2

## 2017-02-21 MED ORDER — CYCLOBENZAPRINE HCL 10 MG PO TABS
10.0000 mg | ORAL_TABLET | Freq: Two times a day (BID) | ORAL | Status: DC | PRN
  Administered 2017-02-22 – 2017-02-23 (×3): 10 mg via ORAL
  Filled 2017-02-21 (×3): qty 1

## 2017-02-21 MED ORDER — FLUTICASONE PROPIONATE 50 MCG/ACT NA SUSP: 1.0000 | Freq: Every day | NASAL | Status: DC | PRN

## 2017-02-21 MED ORDER — LISINOPRIL 20 MG PO TABS
20.0000 mg | ORAL_TABLET | Freq: Every day | ORAL | Status: DC
  Administered 2017-02-22 – 2017-02-23 (×2): 20 mg via ORAL
  Filled 2017-02-21 (×2): qty 1

## 2017-02-21 MED ORDER — SODIUM CHLORIDE 0.9 % IV SOLN
INTRAVENOUS | Status: DC
  Administered 2017-02-22: 03:00:00 via INTRAVENOUS

## 2017-02-21 MED ORDER — LORATADINE 10 MG PO TABS
10.0000 mg | ORAL_TABLET | Freq: Every day | ORAL | Status: DC
  Administered 2017-02-22 – 2017-02-23 (×2): 10 mg via ORAL
  Filled 2017-02-21 (×2): qty 1

## 2017-02-21 MED ORDER — ACETAMINOPHEN 325 MG PO TABS
ORAL_TABLET | ORAL | Status: AC
  Filled 2017-02-21: qty 2

## 2017-02-21 MED ORDER — LATANOPROST 0.005 % OP SOLN
1.0000 [drp] | Freq: Every day | OPHTHALMIC | Status: DC
  Administered 2017-02-22 (×2): 1 [drp] via OPHTHALMIC
  Filled 2017-02-21: qty 2.5

## 2017-02-21 MED ORDER — ZAFIRLUKAST 20 MG PO TABS
20.0000 mg | ORAL_TABLET | Freq: Every evening | ORAL | Status: DC
  Administered 2017-02-22: 20 mg via ORAL
  Filled 2017-02-21: qty 1

## 2017-02-21 MED ORDER — SODIUM CHLORIDE 0.9 % IV SOLN
2000.0000 mg | Freq: Once | INTRAVENOUS | Status: AC
  Administered 2017-02-21: 2000 mg via INTRAVENOUS
  Filled 2017-02-21: qty 2000

## 2017-02-21 MED ORDER — DEXTROSE 5 % IV SOLN
1.0000 g | INTRAVENOUS | Status: DC
  Administered 2017-02-21 – 2017-02-22 (×2): 1 g via INTRAVENOUS
  Filled 2017-02-21 (×2): qty 10

## 2017-02-21 MED ORDER — FENTANYL CITRATE (PF) 100 MCG/2ML IJ SOLN
50.0000 ug | Freq: Once | INTRAMUSCULAR | Status: AC
  Administered 2017-02-21: 50 ug via INTRAVENOUS
  Filled 2017-02-21: qty 2

## 2017-02-21 MED ORDER — VITAMIN D (ERGOCALCIFEROL) 1.25 MG (50000 UNIT) PO CAPS
50000.0000 [IU] | ORAL_CAPSULE | ORAL | Status: DC
  Administered 2017-02-22: 50000 [IU] via ORAL
  Filled 2017-02-21: qty 1

## 2017-02-21 MED ORDER — ALBUTEROL SULFATE (2.5 MG/3ML) 0.083% IN NEBU: 2.5000 mg | INHALATION_SOLUTION | Freq: Four times a day (QID) | RESPIRATORY_TRACT | Status: DC | PRN

## 2017-02-21 MED ORDER — OLOPATADINE HCL 0.1 % OP SOLN
1.0000 [drp] | Freq: Every day | OPHTHALMIC | Status: DC
  Administered 2017-02-22 – 2017-02-23 (×2): 1 [drp] via OPHTHALMIC
  Filled 2017-02-21: qty 5

## 2017-02-21 MED ORDER — IOPAMIDOL (ISOVUE-300) INJECTION 61%
INTRAVENOUS | Status: AC
  Administered 2017-02-21: 100 mL
  Filled 2017-02-21: qty 100

## 2017-02-21 MED ORDER — DOXYCYCLINE HYCLATE 100 MG PO TABS
100.0000 mg | ORAL_TABLET | Freq: Two times a day (BID) | ORAL | Status: DC
  Administered 2017-02-21 – 2017-02-23 (×4): 100 mg via ORAL
  Filled 2017-02-21 (×4): qty 1

## 2017-02-21 MED ORDER — VANCOMYCIN HCL 10 G IV SOLR
1250.0000 mg | Freq: Two times a day (BID) | INTRAVENOUS | Status: DC
  Filled 2017-02-21: qty 1250

## 2017-02-21 MED ORDER — PANTOPRAZOLE SODIUM 40 MG PO TBEC
40.0000 mg | DELAYED_RELEASE_TABLET | Freq: Every day | ORAL | Status: DC
  Administered 2017-02-22 – 2017-02-23 (×2): 40 mg via ORAL
  Filled 2017-02-21 (×2): qty 1

## 2017-02-21 MED ORDER — HYDROCODONE-ACETAMINOPHEN 10-325 MG PO TABS
1.0000 | ORAL_TABLET | Freq: Four times a day (QID) | ORAL | Status: DC | PRN
  Administered 2017-02-22 – 2017-02-23 (×5): 1 via ORAL
  Filled 2017-02-21 (×5): qty 1

## 2017-02-21 NOTE — ED Notes (Signed)
Phlebotomy made aware of need for second set of blood cultures

## 2017-02-21 NOTE — ED Notes (Signed)
Pt refuses further iv attempt. By IV team.
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
## 2017-02-21 NOTE — ED Notes (Signed)
Patient transported to CT 

## 2017-02-21 NOTE — ED Triage Notes (Signed)
Pt c/o suprapubic pain onset x 3 days, pt reports large amt of white vaginal discharge, pt c/o nausea, denies v/d, A&O x4

## 2017-02-21 NOTE — ED Notes (Addendum)
PA aware that second line and cultures not drawn. States to start zosyn.iv team attempting wihtout success.

## 2017-02-21 NOTE — ED Notes (Signed)
This RN attempted for IV x 2

## 2017-02-21 NOTE — ED Notes (Signed)
Patient placed on 2L O2 due to SPO2 remaining at 87%.

## 2017-02-21 NOTE — Progress Notes (Signed)
Pharmacy Antibiotic Note  Sara GreaserDebbie A Juarez is a 56 y.o. female admitted on 02/21/2017 with sepsis.  Pharmacy has been consulted for vancomycin dosing.  Last temperature recorded was 101.33F, WBC elevated at 11.5, and lactic acid 2.90. Serum creatinine is currently 0.87, baseline is unknown.   Plan: Vancomycin 2000 mg IV x1, then 1250 mg IV every 12 hours (trough goal 15-20) Monitor renal function, clinical progress, VT at steady state Follow up plans for gram negative coverage  Height: 5\' 4"  (162.6 cm) Weight: 208 lb (94.3 kg) IBW/kg (Calculated) : 54.7  Temp (24hrs), Avg:101.6 F (38.7 C), Min:101.6 F (38.7 C), Max:101.6 F (38.7 C)   Recent Labs Lab 02/21/17 1419 02/21/17 1436 02/21/17 1849  WBC 11.5*  --   --   CREATININE 0.87  --   --   LATICACIDVEN  --  2.76* 2.90*    Estimated Creatinine Clearance: 80.4 mL/min (by C-G formula based on SCr of 0.87 mg/dL).    Allergies  Allergen Reactions  . Erythromycin     Other reaction(s): CRAMPS  . Aspirin Itching and Rash    Other reaction(s): RASH    Antimicrobials this admission: 3/5 zosyn x1 in ED 3/5 vancomycin >>   Dose adjustments this admission:   Microbiology results: 3/5 BCx: sent 3/5 UCx: sent  3/5 wet prep: sent  Thank you for allowing pharmacy to be a part of this patient's care.  Milus MallickMargaret E [facility] - Sara Juarez 02/21/2017 7:22 PM

## 2017-02-21 NOTE — H&P (Signed)
History and Physical    Sara Juarez RUE:[id number] DOB: July 17, 1961 DOA: 02/21/2017   PCP: [name] Chief Complaint:  Chief Complaint  Patient presents with  . Abdominal Pain    HPI: Sara Juarez is a 56 y.o. female with medical history significant of DM2.  Patient presents to the ED with c/o abdominal pain, flank pain, and vaginal discharge.  Symptoms ongoing for past 3 days.  Not helped by monostat at home.  Does usually get yeast infections after ABx but no recent ABx use.  No h/o STDs in past but did just get married in Nov 2017.  ED Course: UA shows UTI, CT scan shows pyelonephritis.  Tm 101.6, tachy to 110s-120.  WBC 11.5.  GC and chlamydia are pending.  Review of Systems: As per HPI otherwise 10 point review of systems negative.    Past Medical History:  Diagnosis Date  . Anxiety   . Cervical herniated disc   . Diabetes mellitus without complication (HCC)   . Glaucoma (increased eye pressure)     History reviewed. No pertinent surgical history.   reports that she has been smoking.  She has never used smokeless tobacco. She reports that she does not drink alcohol or use drugs.  Allergies  Allergen Reactions  . Erythromycin Other (See Comments)    Other reaction(s): CRAMPS  . Aspirin Itching and Rash    Other reaction(s): RASH    No family history on file. One new sexual partner (patient just married).   Prior to Admission medications   Medication Sig Start Date End Date Taking? Authorizing Provider  ALPRAZolam [name]) 0.5 MG tablet Take 0.25-0.5 mg by mouth 2 (two) times daily as needed for anxiety.  08/07/16  Yes Historical Provider, MD  cetirizine (ZYRTEC) 10 MG tablet Take 10 mg by mouth daily.   Yes Historical Provider, MD  cyclobenzaprine (FLEXERIL) 10 MG tablet Take 1 tablet (10 mg total) by mouth 2 (two) times daily as needed for muscle spasms. 09/07/16  Yes [name], PA-C  fluticasone (FLONASE) 50 MCG/ACT nasal spray Place 1  spray into both nostrils daily as needed for allergies. 06/16/16  Yes Historical Provider, MD  HYDROcodone-acetaminophen (NORCO) 10-325 MG tablet Take 1 tablet by mouth every 6 (six) hours as needed for pain. 07/08/16  Yes Historical Provider, MD  lisinopril (PRINIVIL,ZESTRIL) 20 MG tablet Take 20 mg by mouth daily.   Yes Historical Provider, MD  LUMIGAN 0.01 % SOLN Place 1 drop into both eyes every evening. 06/04/16  Yes Historical Provider, MD  metFORMIN (GLUCOPHAGE) 500 MG tablet Take 500 mg by mouth 2 (two) times daily with a meal.   Yes Historical Provider, MD  omeprazole (PRILOSEC) 20 MG capsule Take 20 mg by mouth 2 (two) times daily before a meal.  07/08/16  Yes Historical Provider, MD  PAZEO 0.7 % SOLN Place 1 drop into both eyes daily. 06/16/16  Yes Historical Provider, MD  PROAIR HFA 108 (90 Base) MCG/ACT inhaler Inhale 1 puff into the lungs every 6 (six) hours as needed for wheezing. 06/08/16  Yes Historical Provider, MD  triamcinolone cream (KENALOG) 0.1 % Apply 1 application topically daily. 01/14/17  Yes Historical Provider, MD  Vitamin D, Ergocalciferol, (DRISDOL) 50000 units CAPS capsule Take 50,000 Units by mouth every Tuesday. 02/09/17  Yes Historical Provider, MD  zafirlukast (ACCOLATE) 20 MG tablet Take 20 mg by mouth every evening. 06/16/16  Yes Historical Provider, MD    Physical Exam: Vitals:   02/21/17 1930 02/21/17  2106 02/21/17 2145 02/21/17 2152  BP: 152/73 110/78 124/91   Pulse: 110 108 105   Resp: 21 (!) 27 (!) 32   Temp:    100.9 F (38.3 C)  TempSrc:    Rectal  SpO2: 95% 93% 95%   Weight:      Height:          Constitutional: NAD, calm, comfortable Eyes: PERRL, lids and conjunctivae normal ENMT: Mucous membranes are moist. Posterior pharynx clear of any exudate or lesions.Normal dentition.  Neck: normal, supple, no masses, no thyromegaly Respiratory: clear to auscultation bilaterally, no wheezing, no crackles. Normal respiratory effort. No accessory muscle use.   Cardiovascular: Regular rate and rhythm, no murmurs / rubs / gallops. No extremity edema. 2+ pedal pulses. No carotid bruits.  Abdomen: no tenderness, no masses palpated. No hepatosplenomegaly. Bowel sounds positive.  Musculoskeletal: no clubbing / cyanosis. No joint deformity upper and lower extremities. Good ROM, no contractures. Normal muscle tone.  Skin: no rashes, lesions, ulcers. No induration Neurologic: CN 2-12 grossly intact. Sensation intact, DTR normal. Strength 5/5 in all 4.  Psychiatric: Normal judgment and insight. Alert and oriented x 3. Normal mood.    Labs on Admission: I have personally reviewed following labs and imaging studies  CBC:  Recent Labs Lab 02/21/17 1419  WBC 11.5*  NEUTROABS 7.8*  HGB 15.9*  HCT 46.7*  MCV 84.6  PLT 312   Basic Metabolic Panel:  Recent Labs Lab 02/21/17 1419  NA 134*  K 4.1  CL 96*  CO2 26  GLUCOSE 384*  BUN 9  CREATININE 0.87  CALCIUM 9.6   GFR: Estimated Creatinine Clearance: 80.4 mL/min (by C-G formula based on SCr of 0.87 mg/dL). Liver Function Tests:  Recent Labs Lab 02/21/17 1419  AST 19  ALT 23  ALKPHOS 68  BILITOT 0.7  PROT 8.1  ALBUMIN 3.9   No results for input(s): LIPASE, AMYLASE in the last 168 hours. No results for input(s): AMMONIA in the last 168 hours. Coagulation Profile: No results for input(s): INR, PROTIME in the last 168 hours. Cardiac Enzymes: No results for input(s): CKTOTAL, CKMB, CKMBINDEX, TROPONINI in the last 168 hours. BNP (last 3 results) No results for input(s): PROBNP in the last 8760 hours. HbA1C: No results for input(s): HGBA1C in the last 72 hours. CBG: No results for input(s): GLUCAP in the last 168 hours. Lipid Profile: No results for input(s): CHOL, HDL, LDLCALC, TRIG, CHOLHDL, LDLDIRECT in the last 72 hours. Thyroid Function Tests: No results for input(s): TSH, T4TOTAL, FREET4, T3FREE, THYROIDAB in the last 72 hours. Anemia Panel: No results for input(s):  VITAMINB12, FOLATE, FERRITIN, TIBC, IRON, RETICCTPCT in the last 72 hours. Urine analysis:    Component Value Date/Time   COLORURINE YELLOW 02/21/2017 1425   APPEARANCEUR CLOUDY (A) 02/21/2017 1425   LABSPEC 1.023 02/21/2017 1425   PHURINE 6.0 02/21/2017 1425   GLUCOSEU >=500 (A) 02/21/2017 1425   HGBUR SMALL (A) 02/21/2017 1425   BILIRUBINUR NEGATIVE 02/21/2017 1425   KETONESUR NEGATIVE 02/21/2017 1425   PROTEINUR 100 (A) 02/21/2017 1425   NITRITE NEGATIVE 02/21/2017 1425   LEUKOCYTESUR LARGE (A) 02/21/2017 1425   Sepsis Labs: @LABRCNTIP (procalcitonin:4,lacticidven:4) ) Recent Results (from the past 240 hour(s))  Wet prep, genital     Status: Abnormal   Collection Time: 02/21/17  6:40 PM  Result Value Ref Range Status   Yeast Wet Prep HPF POC NONE SEEN NONE SEEN Final   Trich, Wet Prep NONE SEEN NONE SEEN Final   Clue  Cells Wet Prep HPF POC NONE SEEN NONE SEEN Final   WBC, Wet Prep HPF POC MODERATE (A) NONE SEEN Final   Sperm NONE SEEN  Final     Radiological Exams on Admission: Dg Chest 2 View  Result Date: 02/21/2017 CLINICAL DATA:  Fever and productive cough.  Shortness of breath. EXAM: CHEST  2 VIEW COMPARISON:  May 16, 2016 FINDINGS: The heart size and mediastinal contours are within normal limits. Both lungs are clear. The visualized skeletal structures are unremarkable. IMPRESSION: No active cardiopulmonary disease. Electronically Signed   By: Gerome Samavid  Williams III M.D   On: 02/21/2017 17:18   Ct Chest W Contrast  Result Date: 02/21/2017 CLINICAL DATA:  56 year old with lower abdominal pain for 4-5 days. Sepsis. EXAM: CT CHEST, ABDOMEN, AND PELVIS WITH CONTRAST TECHNIQUE: Multidetector CT imaging of the chest, abdomen and pelvis was performed following the standard protocol during bolus administration of intravenous contrast. CONTRAST:  100mL ISOVUE-300 IOPAMIDOL (ISOVUE-300) INJECTION 61% COMPARISON:  12/11/2013.  Chest radiograph 02/21/2017 FINDINGS: CT CHEST FINDINGS  Cardiovascular: Atherosclerotic calcifications of the thoracic aorta without aneurysm. Main pulmonary arteries appear to be patent. Coronary artery calcifications. Mediastinum/Nodes: Mild fullness of the hilar tissue bilaterally. No significant mediastinal lymphadenopathy. No significant pericardial fluid. Lungs/Pleura: No pleural effusions. Trachea and mainstem bronchi are patent. Scattered areas of mild septal thickening particularly in the upper lungs. Subtle reticular densities along the anterior left upper lobe. Peripheral densities in both lower lobes may represent atelectasis. No large areas of airspace disease or consolidation. Musculoskeletal: No acute abnormality. CT ABDOMEN PELVIS FINDINGS Hepatobiliary: Diffuse low-density in the liver is compatible with hepatic steatosis. Normal appearance of the gallbladder. Portal venous system is patent. Pancreas: Normal appearance of the pancreas without inflammation or duct dilatation. Spleen: Normal appearance of spleen without enlargement. Adrenals/Urinary Tract: Normal adrenal glands. There is mild fullness in the renal pelvis in both kidneys. Edema along the course of the ureters bilaterally, no significant hydronephrosis. Small amount of fluid in the urinary bladder. No large stones. Left kidney has a slightly striated appearance on the delayed images and this may represent subtle pyelonephritis. Stomach/Bowel: Colonic diverticula. No focal bowel inflammation. Negative for a bowel obstruction. Vascular/Lymphatic: Atherosclerotic calcifications in the abdominal aorta without aneurysm. No significant lymph node enlargement in the abdomen or pelvis. Reproductive: Status post hysterectomy. No adnexal masses. Other: Edema along the course of the ureters bilaterally and along the pelvic sidewalls. Trace ascites in the pelvis. Ventral hernia containing a small amount of bowel but no obstruction. Negative for free air. Musculoskeletal: No acute bone abnormality.  IMPRESSION: Inflammatory changes along the ureters, left side greater than right. In addition, there is subtle abnormal enhancement in the left kidney on the delayed images that raises concern for pyelonephritis. Findings are compatible with an underlying urinary tract infection or inflammation. Hepatic steatosis. Mild septal thickening in lungs which may represent chronic changes. No acute infectious or inflammatory process in the chest. Umbilical hernia containing a small amount of bowel. No evidence for obstruction. Electronically Signed   By: Richarda OverlieAdam  Henn M.D.   On: 02/21/2017 21:19   Ct Abdomen Pelvis W Contrast  Result Date: 02/21/2017 CLINICAL DATA:  56 year old with lower abdominal pain for 4-5 days. Sepsis. EXAM: CT CHEST, ABDOMEN, AND PELVIS WITH CONTRAST TECHNIQUE: Multidetector CT imaging of the chest, abdomen and pelvis was performed following the standard protocol during bolus administration of intravenous contrast. CONTRAST:  100mL ISOVUE-300 IOPAMIDOL (ISOVUE-300) INJECTION 61% COMPARISON:  12/11/2013.  Chest radiograph 02/21/2017 FINDINGS: CT  CHEST FINDINGS Cardiovascular: Atherosclerotic calcifications of the thoracic aorta without aneurysm. Main pulmonary arteries appear to be patent. Coronary artery calcifications. Mediastinum/Nodes: Mild fullness of the hilar tissue bilaterally. No significant mediastinal lymphadenopathy. No significant pericardial fluid. Lungs/Pleura: No pleural effusions. Trachea and mainstem bronchi are patent. Scattered areas of mild septal thickening particularly in the upper lungs. Subtle reticular densities along the anterior left upper lobe. Peripheral densities in both lower lobes may represent atelectasis. No large areas of airspace disease or consolidation. Musculoskeletal: No acute abnormality. CT ABDOMEN PELVIS FINDINGS Hepatobiliary: Diffuse low-density in the liver is compatible with hepatic steatosis. Normal appearance of the gallbladder. Portal venous system is  patent. Pancreas: Normal appearance of the pancreas without inflammation or duct dilatation. Spleen: Normal appearance of spleen without enlargement. Adrenals/Urinary Tract: Normal adrenal glands. There is mild fullness in the renal pelvis in both kidneys. Edema along the course of the ureters bilaterally, no significant hydronephrosis. Small amount of fluid in the urinary bladder. No large stones. Left kidney has a slightly striated appearance on the delayed images and this may represent subtle pyelonephritis. Stomach/Bowel: Colonic diverticula. No focal bowel inflammation. Negative for a bowel obstruction. Vascular/Lymphatic: Atherosclerotic calcifications in the abdominal aorta without aneurysm. No significant lymph node enlargement in the abdomen or pelvis. Reproductive: Status post hysterectomy. No adnexal masses. Other: Edema along the course of the ureters bilaterally and along the pelvic sidewalls. Trace ascites in the pelvis. Ventral hernia containing a small amount of bowel but no obstruction. Negative for free air. Musculoskeletal: No acute bone abnormality. IMPRESSION: Inflammatory changes along the ureters, left side greater than right. In addition, there is subtle abnormal enhancement in the left kidney on the delayed images that raises concern for pyelonephritis. Findings are compatible with an underlying urinary tract infection or inflammation. Hepatic steatosis. Mild septal thickening in lungs which may represent chronic changes. No acute infectious or inflammatory process in the chest. Umbilical hernia containing a small amount of bowel. No evidence for obstruction. Electronically Signed   By: Richarda Overlie M.D.   On: 02/21/2017 21:19    EKG: Independently reviewed.  Assessment/Plan Principal Problem:   Pyelonephritis Active Problems:   Vaginal discharge   Sepsis (HCC)   DM2 (diabetes mellitus, type 2) (HCC)   HTN (hypertension)    1. Pyelonephritis causing sepsis  - 1. Rocephin 2. Cultures pending 3. IVF: 4L in ED and BP now up to 150s systolic from 100 systolic on presentation 2. Vaginal discharge - 1. Will add doxycycline to cover for chlamydia (GC should be covered by rocephin). 3. DM2-  1. Hold metformin post CT scan 2. Mod scale SSI AC 4. HTN - BP now 150s systolic after IVF resuscitation, so will continue home lisinopril   DVT prophylaxis: Lovenox Code Status: Full Family Communication: No family in room Consults called: None Admission status: Place in 62, Heywood Iles. DO [facility] Pager [phone] from 7PM-7AM  If 7AM-7PM, please contact the day physician for the patient [email or web address] Password [facility]  02/21/2017, 11:31 PM

## 2017-02-21 NOTE — ED Triage Notes (Signed)
Lactic acid 2.76 from lab, Dr. Eudelia Bunchardama notified

## 2017-02-21 NOTE — ED Notes (Signed)
Second IV attempted x 1.

## 2017-02-21 NOTE — ED Notes (Signed)
IV team at bedside 

## 2017-02-22 DIAGNOSIS — H409 Unspecified glaucoma: Secondary | ICD-10-CM | POA: Diagnosis present

## 2017-02-22 DIAGNOSIS — Z79899 Other long term (current) drug therapy: Secondary | ICD-10-CM | POA: Diagnosis not present

## 2017-02-22 DIAGNOSIS — Z888 Allergy status to other drugs, medicaments and biological substances status: Secondary | ICD-10-CM | POA: Diagnosis not present

## 2017-02-22 DIAGNOSIS — E119 Type 2 diabetes mellitus without complications: Secondary | ICD-10-CM | POA: Diagnosis present

## 2017-02-22 DIAGNOSIS — B9620 Unspecified Escherichia coli [E. coli] as the cause of diseases classified elsewhere: Secondary | ICD-10-CM | POA: Diagnosis present

## 2017-02-22 DIAGNOSIS — Z7951 Long term (current) use of inhaled steroids: Secondary | ICD-10-CM | POA: Diagnosis not present

## 2017-02-22 DIAGNOSIS — R102 Pelvic and perineal pain: Secondary | ICD-10-CM | POA: Diagnosis present

## 2017-02-22 DIAGNOSIS — N898 Other specified noninflammatory disorders of vagina: Secondary | ICD-10-CM | POA: Diagnosis present

## 2017-02-22 DIAGNOSIS — A419 Sepsis, unspecified organism: Secondary | ICD-10-CM | POA: Diagnosis present

## 2017-02-22 DIAGNOSIS — I10 Essential (primary) hypertension: Secondary | ICD-10-CM | POA: Diagnosis present

## 2017-02-22 DIAGNOSIS — Z7984 Long term (current) use of oral hypoglycemic drugs: Secondary | ICD-10-CM | POA: Diagnosis not present

## 2017-02-22 DIAGNOSIS — Z6835 Body mass index (BMI) 35.0-35.9, adult: Secondary | ICD-10-CM | POA: Diagnosis not present

## 2017-02-22 DIAGNOSIS — N12 Tubulo-interstitial nephritis, not specified as acute or chronic: Secondary | ICD-10-CM | POA: Diagnosis not present

## 2017-02-22 DIAGNOSIS — F17200 Nicotine dependence, unspecified, uncomplicated: Secondary | ICD-10-CM | POA: Diagnosis present

## 2017-02-22 DIAGNOSIS — R000 Tachycardia, unspecified: Secondary | ICD-10-CM | POA: Diagnosis present

## 2017-02-22 DIAGNOSIS — A4151 Sepsis due to Escherichia coli [E. coli]: Secondary | ICD-10-CM | POA: Diagnosis not present

## 2017-02-22 LAB — GLUCOSE, CAPILLARY
GLUCOSE-CAPILLARY: 320 mg/dL — AB (ref 65–99)
GLUCOSE-CAPILLARY: 283 mg/dL — AB (ref 65–99)
GLUCOSE-CAPILLARY: 265 mg/dL — AB (ref 65–99)
Glucose-Capillary: 351 mg/dL — ABNORMAL HIGH (ref 65–99)

## 2017-02-22 LAB — HIV ANTIBODY (ROUTINE TESTING W REFLEX): HIV Screen 4th Generation wRfx: NONREACTIVE

## 2017-02-22 LAB — RPR: RPR Ser Ql: NONREACTIVE

## 2017-02-22 LAB — GC/CHLAMYDIA PROBE AMP (~~LOC~~) NOT AT ARMC
CHLAMYDIA, DNA PROBE: NEGATIVE
NEISSERIA GONORRHEA: NEGATIVE

## 2017-02-22 MED ORDER — INSULIN ASPART 100 UNIT/ML ~~LOC~~ SOLN
5.0000 [IU] | Freq: Once | SUBCUTANEOUS | Status: AC
  Administered 2017-02-22: 5 [IU] via SUBCUTANEOUS

## 2017-02-22 MED ORDER — METFORMIN HCL 500 MG PO TABS
500.0000 mg | ORAL_TABLET | Freq: Two times a day (BID) | ORAL | Status: DC
  Administered 2017-02-22 – 2017-02-23 (×2): 500 mg via ORAL
  Filled 2017-02-22 (×2): qty 1

## 2017-02-22 NOTE — Progress Notes (Signed)
Inpatient Diabetes Program Recommendations  AACE/ADA: New Consensus Statement on Inpatient Glycemic Control (2015)  Target Ranges:  Prepandial:   less than 140 mg/dL      Peak postprandial:   less than 180 mg/dL (1-2 hours)      Critically ill patients:  140 - 180 mg/dL   Results for Sara Juarez, Sara Juarez (MRN [id number]) as of 02/22/2017 07:57  Ref. Range 02/22/2017 02:43 02/22/2017 06:23  Glucose-Capillary Latest Ref Range: 65 - 99 mg/dL 782351 (H) 956320 (H)    Admit with: Pyelonephritis/ Sepsis  History: DM2  Home DM Meds: Metformin 500 mg BID  Current Insulin Orders: Novolog Moderate Correction Scale/ SSI (0-15 units) TID AC      MD- Please consider the following in-hospital insulin adjustments:  1. Start basal insulin if pt continues to have elevated fasting glucose levels: Recommend Lantus 10 units daily (0.1 units/kg dosing to start)  2. Check current A1c level to assess home glucose control (no A1c on file)     --Will follow patient during hospitalization--  Ambrose FinlandJeannine Johnston Kamron Vanwyhe RN, MSN, CDE Diabetes Coordinator Inpatient Glycemic Control Team Team Pager: [phone] (8a-5p)

## 2017-02-22 NOTE — Progress Notes (Addendum)
Patient ID: Sara Juarez, female   DOB: 10/25/1961, 56 y.o.   MRN: [id number]    PROGRESS NOTE    Sara Juarez  WHQ:[id number] DOB: 1961/10/21 DOA: 02/21/2017  PCP: Marry Guan   Brief Narrative:  56 y.o. female with medical history significant of DM2, presented to the ED with c/o abdominal pain, left flank pain, and vaginal discharge that started 3 days prior to this admission.   Assessment & Plan:   Principal Problem:   Sepsis secondary to Left Pyelonephritis, E. Coli UTI - please note that pt met criteria for sepsis with HR > 90, T up to 101.6 F, lactic acid 2.7 --> 2.9 - source left pyelonephritis and E. Coli UTI, ? STD [place] - pt started on Rocephin and Doxycycline in ED, will continue same regimen for now, day #2 - follow up on final urine and blood cultures - encourage oral hydration   Active Problems:   Vaginal discharge - already started on ABX as noted above  - follow clinical response     DM2 (diabetes mellitus, type 2) ([place]) - resume home regime with metformin - check A1C - added SSI     HTN (hypertension), essential - reasonable inpatient control  - continue Lisinopril     Morbid obesity - pt meets criteria with BMI > 35 and underlying HTN, DM - Body mass index is 35.7 kg/m.  DVT prophylaxis: Lovenox SQ Code Status: Full  Family Communication: Patient and significant other at bedside  Disposition Plan: Home once medically better and final urine cultures back so that we can narrow down ABX appropriately   Consultants:   None  Procedures:   None  Antimicrobials:   Rocephin 3/5 -->  Doxycycline 3/5 -->   Subjective: Still with left groin and flank area pain but says feels much better compared to yesterday   Objective: Vitals:   02/22/17 0100 02/22/17 0229 02/22/17 0626 02/22/17 1355  BP: 133/64 128/67 140/77 130/75  Pulse: 109 (!) 104 (!) 110 (!) 111  Resp:   (!) 28 18  Temp:  100.3 F (37.9 C) 99 F (37.2 C) 99.6  F (37.6 C)  TempSrc:  Oral Oral Oral  SpO2: 97% 94% 94% 94%  Weight:      Height:        Intake/Output Summary (Last 24 hours) at 02/22/17 1438 Last data filed at 02/22/17 1300  Gross per 24 hour  Intake             3370 ml  Output                0 ml  Net             3370 ml   Filed Weights   02/21/17 1601  Weight: 94.3 kg (208 lb)   Examination:  General exam: Appears calm and comfortable  Respiratory system: Clear to auscultation. Respiratory effort normal. Cardiovascular system: S1 & S2 heard, RRR. No JVD, murmurs, rubs, gallops or clicks. No pedal edema. Gastrointestinal system: Abdomen is nondistended, soft and nontender. TTP in left flank and left groin arra Central nervous system: Alert and oriented. No focal neurological deficits. Extremities: Symmetric 5 x 5 power.  Data Reviewed: I have personally reviewed following labs and imaging studies  CBC:  Recent Labs Lab 02/21/17 1419  WBC 11.5*  NEUTROABS 7.8*  HGB 15.9*  HCT 46.7*  MCV 84.6  PLT 659   Basic Metabolic Panel:  Recent Labs Lab 02/21/17 1419  NA 134*  K 4.1  CL 96*  CO2 26  GLUCOSE 384*  BUN 9  CREATININE 0.87  CALCIUM 9.6   Liver Function Tests:  Recent Labs Lab 02/21/17 1419  AST 19  ALT 23  ALKPHOS 68  BILITOT 0.7  PROT 8.1  ALBUMIN 3.9   CBG:  Recent Labs Lab 02/22/17 0243 02/22/17 0623 02/22/17 1218  GLUCAP 351* 320* 283*   Urine analysis:    Component Value Date/Time   COLORURINE YELLOW 02/21/2017 1425   APPEARANCEUR CLOUDY (A) 02/21/2017 1425   LABSPEC 1.023 02/21/2017 1425   PHURINE 6.0 02/21/2017 1425   GLUCOSEU >=500 (A) 02/21/2017 1425   HGBUR SMALL (A) 02/21/2017 1425   BILIRUBINUR NEGATIVE 02/21/2017 1425   KETONESUR NEGATIVE 02/21/2017 1425   PROTEINUR 100 (A) 02/21/2017 1425   NITRITE NEGATIVE 02/21/2017 1425   LEUKOCYTESUR LARGE (A) 02/21/2017 1425    Recent Results (from the past 240 hour(s))  Urine culture     Status: Abnormal  (Preliminary result)   Collection Time: 02/21/17  2:25 PM  Result Value Ref Range Status   Specimen Description URINE, CLEAN CATCH  Final   Special Requests Normal  Final   Culture (A)  Final    >=100,000 COLONIES/mL ESCHERICHIA COLI SUSCEPTIBILITIES TO FOLLOW    Report Status PENDING  Incomplete  Blood Culture (routine x 2)     Status: None (Preliminary result)   Collection Time: 02/21/17  3:55 PM  Result Value Ref Range Status   Specimen Description BLOOD RIGHT ANTECUBITAL  Final   Special Requests BOTTLES DRAWN AEROBIC AND ANAEROBIC 5CC  Final   Culture NO GROWTH < 24 HOURS  Final   Report Status PENDING  Incomplete  Blood Culture (routine x 2)     Status: None (Preliminary result)   Collection Time: 02/21/17  6:20 PM  Result Value Ref Range Status   Specimen Description BLOOD LEFT ANTECUBITAL  Final   Special Requests IN PEDIATRIC BOTTLE 2CC  Final   Culture NO GROWTH < 24 HOURS  Final   Report Status PENDING  Incomplete  Wet prep, genital     Status: Abnormal   Collection Time: 02/21/17  6:40 PM  Result Value Ref Range Status   Yeast Wet Prep HPF POC NONE SEEN NONE SEEN Final   Trich, Wet Prep NONE SEEN NONE SEEN Final   Clue Cells Wet Prep HPF POC NONE SEEN NONE SEEN Final   WBC, Wet Prep HPF POC MODERATE (A) NONE SEEN Final   Sperm NONE SEEN  Final    Radiology Studies: Dg Chest 2 View Result Date: 02/21/2017 No active cardiopulmonary disease.   Ct Chest W Contrast Result Date: 02/21/2017 Inflammatory changes along the ureters, left side greater than right. In addition, there is subtle abnormal enhancement in the left kidney on the delayed images that raises concern for pyelonephritis. Findings are compatible with an underlying urinary tract infection or inflammation. Hepatic steatosis. Mild septal thickening in lungs which may represent chronic changes. No acute infectious or inflammatory process in the chest. Umbilical hernia containing a small amount of bowel. No  evidence for obstruction.  Ct Abdomen Pelvis W Contrast Result Date: 02/21/2017 Inflammatory changes along the ureters, left side greater than right. In addition, there is subtle abnormal enhancement in the left kidney on the delayed images that raises concern for pyelonephritis. Findings are compatible with an underlying urinary tract infection or inflammation. Hepatic steatosis. Mild septal thickening in lungs which may represent chronic changes. No acute infectious or inflammatory process  in the chest. Umbilical hernia containing a small amount of bowel. No evidence for obstruction.   Scheduled Meds: . cefTRIAXone (ROCEPHIN)  IV  1 g Intravenous Q24H  . doxycycline  100 mg Oral BID  . enoxaparin (LOVENOX) injection  40 mg Subcutaneous Daily  . insulin aspart  0-15 Units Subcutaneous TID WC  . latanoprost  1 drop Both Eyes QHS  . lisinopril  20 mg Oral Daily  . loratadine  10 mg Oral Daily  . olopatadine  1 drop Both Eyes Daily  . pantoprazole  40 mg Oral Daily  . Vitamin D (Ergocalciferol)  50,000 Units Oral Q Tue  . zafirlukast  20 mg Oral QPM   Continuous Infusions:   LOS: 0 days   Time spent: 20 minutes   Faye Ramsay, MD [facility] Pager [phone]  If 7PM-7AM, please contact night-coverage [email or web address] Password [facility] 02/22/2017, 2:38 PM

## 2017-02-23 ENCOUNTER — Encounter (HOSPITAL_COMMUNITY): Payer: Medicare Other

## 2017-02-23 DIAGNOSIS — N12 Tubulo-interstitial nephritis, not specified as acute or chronic: Secondary | ICD-10-CM

## 2017-02-23 DIAGNOSIS — A4151 Sepsis due to Escherichia coli [E. coli]: Secondary | ICD-10-CM

## 2017-02-23 DIAGNOSIS — I10 Essential (primary) hypertension: Secondary | ICD-10-CM

## 2017-02-23 DIAGNOSIS — E119 Type 2 diabetes mellitus without complications: Secondary | ICD-10-CM

## 2017-02-23 DIAGNOSIS — N898 Other specified noninflammatory disorders of vagina: Secondary | ICD-10-CM

## 2017-02-23 LAB — URINE CULTURE: SPECIAL REQUESTS: NORMAL

## 2017-02-23 LAB — BASIC METABOLIC PANEL
Anion gap: 9 (ref 5–15)
BUN: 7 mg/dL (ref 6–20)
CALCIUM: 8.8 mg/dL — AB (ref 8.9–10.3)
CHLORIDE: 99 mmol/L — AB (ref 101–111)
CO2: 26 mmol/L (ref 22–32)
CREATININE: 0.66 mg/dL (ref 0.44–1.00)
GFR calc Af Amer: >60 mL/min (ref >60)
GFR calc non Af Amer: >60 mL/min (ref >60)
Glucose, Bld: 325 mg/dL — ABNORMAL HIGH (ref 65–99)
Potassium: 3.9 mmol/L (ref 3.5–5.1)
SODIUM: 134 mmol/L — AB (ref 135–145)

## 2017-02-23 LAB — CBC
HCT: 40.1 % (ref 36.0–46.0)
Hemoglobin: 12.9 g/dL (ref 12.0–15.0)
MCH: 27.3 pg (ref 26.0–34.0)
MCHC: 32.2 g/dL (ref 30.0–36.0)
MCV: 85 fL (ref 78.0–100.0)
PLATELETS: 298 10*3/uL (ref 150–400)
RBC: 4.72 MIL/uL (ref 3.87–5.11)
RDW: 12.9 % (ref 11.5–15.5)
WBC: 5.6 10*3/uL (ref 4.0–10.5)

## 2017-02-23 LAB — GLUCOSE, CAPILLARY
GLUCOSE-CAPILLARY: 375 mg/dL — AB (ref 65–99)
Glucose-Capillary: 383 mg/dL — ABNORMAL HIGH (ref 65–99)

## 2017-02-23 MED ORDER — FLUCONAZOLE 150 MG PO TABS: 150.0000 mg | ORAL_TABLET | Freq: Every day | ORAL | 0 refills | Status: DC

## 2017-02-23 MED ORDER — AMOXICILLIN-POT CLAVULANATE 875-125 MG PO TABS: 1.0000 | ORAL_TABLET | Freq: Two times a day (BID) | ORAL | 0 refills | Status: DC

## 2017-02-23 NOTE — Progress Notes (Signed)
Discharge instructions and where to pick up prescriptions reviewed with patient. Patient stated understanding. No IV access. Patient has husband at bedside for transportation

## 2017-02-23 NOTE — Progress Notes (Signed)
Inpatient Diabetes Program Recommendations  AACE/ADA: New Consensus Statement on Inpatient Glycemic Control (2015)  Target Ranges:  Prepandial:   less than 140 mg/dL      Peak postprandial:   less than 180 mg/dL (1-2 hours)      Critically ill patients:  140 - 180 mg/dL   Lab Results  Component Value Date   GLUCAP 383 (H) 02/23/2017   Results for Sara Juarez, Sara Juarez (MRN [id number]) as of 02/23/2017 08:32  Ref. Range 02/22/2017 06:23 02/22/2017 12:18 02/22/2017 16:37 02/22/2017 22:09 02/23/2017 06:00  Glucose-Capillary Latest Ref Range: 65 - 99 mg/dL 657320 (H) 846283 (H) 962265 (H) 375 (H) 383 (H)   Current orders for Inpatient glycemic control: Novolog Moderate Correction Scale/ SSI (0-15 units) TID [facility]  Inpatient Diabetes Program Recommendations:    Insulin - Basal: Please consider Lantus 10 units starting now and then daily due to consistently elevated fasting CBG's (320 and 383).  Thank you,  Kristine LineaKaren Coalton Arch, RN, MSN Diabetes Coordinator Inpatient Diabetes Program [phone] (Team Pager)

## 2017-02-23 NOTE — Discharge Summary (Addendum)
Physician Discharge Summary  Sara Juarez ZOX:[id number] DOB: [birth year] DOA: 02/21/2017  PCP: [name]  Admit date: 02/21/2017 Discharge date: 02/23/2017  Time spent: 45 minutes  Recommendations for Outpatient Follow-up:  Patient will be discharged to home.  Patient will need to follow up with primary care provider within one week of discharge.  Patient should continue medications as prescribed.  Patient should follow a heart healthy/carb modified diet.   Discharge Diagnoses:  Sepsis secondary to Left Pyelonephritis, E. Coli UTI Vaginal discharge Diabetes mellitus, type 2 Hypertension, essential Morbid obesity  Discharge Condition: Stable   Diet recommendation: Heart healthy  Filed Weights   02/21/17 1601  Weight: 94.3 kg (208 lb)    History of present illness:  On 02/21/2017 by Dr. Leandrew Koyanagi Juarez is a 56 y.o. female with medical history significant of DM2.  Patient presents to the ED with c/o abdominal pain, flank pain, and vaginal discharge.  Symptoms ongoing for past 3 days.  Not helped by monostat at home.  Does usually get yeast infections after ABx but no recent ABx use.  No h/o STDs in past but did just get married in Nov 2017.  Hospital Course:  Sepsis secondary to Left Pyelonephritis, E. Coli UTI -Upon admission, patient was tachycardic, febrile, with leukocytosis and elevated lactic acid  -UA +, urine culture +Ecoli (pansensitive) -STD G/C negative, HIV nonreactive -Blood cultures show no growth to date -Patient improving rapidly  -Will discharge with Augmentin  -Discussed hygiene and not holding urine, void when the urge arises.  Vaginal discharge -Was started on doxy and ceftriaxone  -G/C negative -Will give dose of diflucan to take after completion of antibiotics as patient states she gets yeasts infections after being on antibiotics -Discussed that this may be normal vaginal discharge, but if it continues to follow up with PCP or  Gyn  Diabetes mellitus, type 2 -resume home regime with metformin -hemoglobin A1c pending  Hypertension, essential -continue Lisinopril   Morbid obesity -pt meets criteria with BMI > 35 and underlying HTN, DM -Discuss lifestyle modifications with PCP upon discharge -Body mass index is 35.7 kg/m.  Right upper extremity swelling -Possibly secondary to IVF -[place] ordered, but patient declined -Advised patient to keep arm elevated, swelling and bruising may worsen.   Procedures: None  Consultations: None  Discharge Exam: Vitals:   02/22/17 2209 02/23/17 0456  BP: (!) 148/89 138/90  Pulse: (!) 107 (!) 110  Resp: 18 18  Temp: 99 F (37.2 C) 99.1 F (37.3 C)   Patient states she is feeling much better today. Denies chest pain, shortness of breath, abdominal pain, N/V/D/C.    General: Well developed, well nourished, NAD, appears stated age  HEENT: NCAT, mucous membranes moist.  Cardiovascular: S1 S2 auscultated, no rubs, murmurs or gallops. Regular rate and rhythm.  Respiratory: Clear to auscultation bilaterally with equal chest rise  Abdomen: Soft, nontender, nondistended, + bowel sounds  Extremities: warm dry without cyanosis clubbing or edema. Mild RUE edema with some erythema in the antecubital area  Neuro: AAOx3, nonfocal  Skin: Without rashes exudates or nodules  Psych: Normal affect and demeanor with intact judgement and insight  Discharge Instructions  Current Discharge Medication List    CONTINUE these medications which have NOT CHANGED   Details  ALPRAZolam (XANAX) 0.5 MG tablet Take 0.25-0.5 mg by mouth 2 (two) times daily as needed for anxiety.     cetirizine (ZYRTEC) 10 MG tablet Take 10 mg by mouth daily.  cyclobenzaprine (FLEXERIL) 10 MG tablet Take 1 tablet (10 mg total) by mouth 2 (two) times daily as needed for muscle spasms. Qty: 20 tablet, Refills: 0    fluticasone (FLONASE) 50 MCG/ACT nasal spray Place 1 spray into both nostrils  daily as needed for allergies.    HYDROcodone-acetaminophen (NORCO) 10-325 MG tablet Take 1 tablet by mouth every 6 (six) hours as needed for pain.    lisinopril (PRINIVIL,ZESTRIL) 20 MG tablet Take 20 mg by mouth daily.    LUMIGAN 0.01 % SOLN Place 1 drop into both eyes every evening.    metFORMIN (GLUCOPHAGE) 500 MG tablet Take 500 mg by mouth 2 (two) times daily with a meal.    omeprazole (PRILOSEC) 20 MG capsule Take 20 mg by mouth 2 (two) times daily before a meal.     PAZEO 0.7 % SOLN Place 1 drop into both eyes daily.    PROAIR HFA 108 (90 Base) MCG/ACT inhaler Inhale 1 puff into the lungs every 6 (six) hours as needed for wheezing.    triamcinolone cream (KENALOG) 0.1 % Apply 1 application topically daily. Refills: 0    Vitamin D, Ergocalciferol, (DRISDOL) 50000 units CAPS capsule Take 50,000 Units by mouth every Tuesday. Refills: 0    zafirlukast (ACCOLATE) 20 MG tablet Take 20 mg by mouth every evening.       Allergies  Allergen Reactions  . Erythromycin Other (See Comments)    Other reaction(s): CRAMPS  . Aspirin Itching and Rash    Other reaction(s): RASH      The results of significant diagnostics from this hospitalization (including imaging, microbiology, ancillary and laboratory) are listed below for reference.    Significant Diagnostic Studies: Dg Chest 2 View  Result Date: 02/21/2017 CLINICAL DATA:  Fever and productive cough.  Shortness of breath. EXAM: CHEST  2 VIEW COMPARISON:  May 16, 2016 FINDINGS: The heart size and mediastinal contours are within normal limits. Both lungs are clear. The visualized skeletal structures are unremarkable. IMPRESSION: No active cardiopulmonary disease. Electronically Signed   By: Gerome Sam III M.D   On: 02/21/2017 17:18   Ct Chest W Contrast  Result Date: 02/21/2017 CLINICAL DATA:  56 year old with lower abdominal pain for 4-5 days. Sepsis. EXAM: CT CHEST, ABDOMEN, AND PELVIS WITH CONTRAST TECHNIQUE: Multidetector  CT imaging of the chest, abdomen and pelvis was performed following the standard protocol during bolus administration of intravenous contrast. CONTRAST:  ISOVUE-300 IOPAMIDOL (ISOVUE-300) INJECTION 61% COMPARISON:  12/11/2013.  Chest radiograph 02/21/2017 FINDINGS: CT CHEST FINDINGS Cardiovascular: Atherosclerotic calcifications of the thoracic aorta without aneurysm. Main pulmonary arteries appear to be patent. Coronary artery calcifications. Mediastinum/Nodes: Mild fullness of the hilar tissue bilaterally. No significant mediastinal lymphadenopathy. No significant pericardial fluid. Lungs/Pleura: No pleural effusions. Trachea and mainstem bronchi are patent. Scattered areas of mild septal thickening particularly in the upper lungs. Subtle reticular densities along the anterior left upper lobe. Peripheral densities in both lower lobes may represent atelectasis. No large areas of airspace disease or consolidation. Musculoskeletal: No acute abnormality. CT ABDOMEN PELVIS FINDINGS Hepatobiliary: Diffuse low-density in the liver is compatible with hepatic steatosis. Normal appearance of the gallbladder. Portal venous system is patent. Pancreas: Normal appearance of the pancreas without inflammation or duct dilatation. Spleen: Normal appearance of spleen without enlargement. Adrenals/Urinary Tract: Normal adrenal glands. There is mild fullness in the renal pelvis in both kidneys. Edema along the course of the ureters bilaterally, no significant hydronephrosis. Small amount of fluid in the urinary bladder. No large stones.  Left kidney has a slightly striated appearance on the delayed images and this may represent subtle pyelonephritis. Stomach/Bowel: Colonic diverticula. No focal bowel inflammation. Negative for a bowel obstruction. Vascular/Lymphatic: Atherosclerotic calcifications in the abdominal aorta without aneurysm. No significant lymph node enlargement in the abdomen or pelvis. Reproductive: Status post  hysterectomy. No adnexal masses. Other: Edema along the course of the ureters bilaterally and along the pelvic sidewalls. Trace ascites in the pelvis. Ventral hernia containing a small amount of bowel but no obstruction. Negative for free air. Musculoskeletal: No acute bone abnormality. IMPRESSION: Inflammatory changes along the ureters, left side greater than right. In addition, there is subtle abnormal enhancement in the left kidney on the delayed images that raises concern for pyelonephritis. Findings are compatible with an underlying urinary tract infection or inflammation. Hepatic steatosis. Mild septal thickening in lungs which may represent chronic changes. No acute infectious or inflammatory process in the chest. Umbilical hernia containing a small amount of bowel. No evidence for obstruction. Electronically Signed   By: Richarda Overlie M.D.   On: 02/21/2017 21:19   Ct Abdomen Pelvis W Contrast  Result Date: 02/21/2017 CLINICAL DATA:  57 year old with lower abdominal pain for 4-5 days. Sepsis. EXAM: CT CHEST, ABDOMEN, AND PELVIS WITH CONTRAST TECHNIQUE: Multidetector CT imaging of the chest, abdomen and pelvis was performed following the standard protocol during bolus administration of intravenous contrast. CONTRAST:  ISOVUE-300 IOPAMIDOL (ISOVUE-300) INJECTION 61% COMPARISON:  12/11/2013.  Chest radiograph 02/21/2017 FINDINGS: CT CHEST FINDINGS Cardiovascular: Atherosclerotic calcifications of the thoracic aorta without aneurysm. Main pulmonary arteries appear to be patent. Coronary artery calcifications. Mediastinum/Nodes: Mild fullness of the hilar tissue bilaterally. No significant mediastinal lymphadenopathy. No significant pericardial fluid. Lungs/Pleura: No pleural effusions. Trachea and mainstem bronchi are patent. Scattered areas of mild septal thickening particularly in the upper lungs. Subtle reticular densities along the anterior left upper lobe. Peripheral densities in both lower lobes may  represent atelectasis. No large areas of airspace disease or consolidation. Musculoskeletal: No acute abnormality. CT ABDOMEN PELVIS FINDINGS Hepatobiliary: Diffuse low-density in the liver is compatible with hepatic steatosis. Normal appearance of the gallbladder. Portal venous system is patent. Pancreas: Normal appearance of the pancreas without inflammation or duct dilatation. Spleen: Normal appearance of spleen without enlargement. Adrenals/Urinary Tract: Normal adrenal glands. There is mild fullness in the renal pelvis in both kidneys. Edema along the course of the ureters bilaterally, no significant hydronephrosis. Small amount of fluid in the urinary bladder. No large stones. Left kidney has a slightly striated appearance on the delayed images and this may represent subtle pyelonephritis. Stomach/Bowel: Colonic diverticula. No focal bowel inflammation. Negative for a bowel obstruction. Vascular/Lymphatic: Atherosclerotic calcifications in the abdominal aorta without aneurysm. No significant lymph node enlargement in the abdomen or pelvis. Reproductive: Status post hysterectomy. No adnexal masses. Other: Edema along the course of the ureters bilaterally and along the pelvic sidewalls. Trace ascites in the pelvis. Ventral hernia containing a small amount of bowel but no obstruction. Negative for free air. Musculoskeletal: No acute bone abnormality. IMPRESSION: Inflammatory changes along the ureters, left side greater than right. In addition, there is subtle abnormal enhancement in the left kidney on the delayed images that raises concern for pyelonephritis. Findings are compatible with an underlying urinary tract infection or inflammation. Hepatic steatosis. Mild septal thickening in lungs which may represent chronic changes. No acute infectious or inflammatory process in the chest. Umbilical hernia containing a small amount of bowel. No evidence for obstruction. Electronically Signed   By: Madelaine Bhat  Lowella DandyHenn M.D.    On: 02/21/2017 21:19    Microbiology: Recent Results (from the past 240 hour(s))  Urine culture     Status: Abnormal   Collection Time: 02/21/17  2:25 PM  Result Value Ref Range Status   Specimen Description URINE, CLEAN CATCH  Final   Special Requests Normal  Final   Culture >=100,000 COLONIES/mL ESCHERICHIA COLI (A)  Final   Report Status 02/23/2017 FINAL  Final   Organism ID, Bacteria ESCHERICHIA COLI (A)  Final      Susceptibility   Escherichia coli - MIC*    AMPICILLIN <=2 SENSITIVE Sensitive     CEFAZOLIN <=4 SENSITIVE Sensitive     CEFTRIAXONE <=1 SENSITIVE Sensitive     CIPROFLOXACIN <=0.25 SENSITIVE Sensitive     GENTAMICIN <=1 SENSITIVE Sensitive     IMIPENEM <=0.25 SENSITIVE Sensitive     NITROFURANTOIN <=16 SENSITIVE Sensitive     TRIMETH/SULFA <=20 SENSITIVE Sensitive     AMPICILLIN/SULBACTAM <=2 SENSITIVE Sensitive     PIP/TAZO <=4 SENSITIVE Sensitive     Extended ESBL NEGATIVE Sensitive     * >=100,000 COLONIES/mL ESCHERICHIA COLI  Blood Culture (routine x 2)     Status: None (Preliminary result)   Collection Time: 02/21/17  3:55 PM  Result Value Ref Range Status   Specimen Description BLOOD RIGHT ANTECUBITAL  Final   Special Requests BOTTLES DRAWN AEROBIC AND ANAEROBIC 5CC  Final   Culture NO GROWTH < 24 HOURS  Final   Report Status PENDING  Incomplete  Blood Culture (routine x 2)     Status: None (Preliminary result)   Collection Time: 02/21/17  6:20 PM  Result Value Ref Range Status   Specimen Description BLOOD LEFT ANTECUBITAL  Final   Special Requests IN PEDIATRIC BOTTLE 2CC  Final   Culture NO GROWTH < 24 HOURS  Final   Report Status PENDING  Incomplete  Wet prep, genital     Status: Abnormal   Collection Time: 02/21/17  6:40 PM  Result Value Ref Range Status   Yeast Wet Prep HPF POC NONE SEEN NONE SEEN Final   Trich, Wet Prep NONE SEEN NONE SEEN Final   Clue Cells Wet Prep HPF POC NONE SEEN NONE SEEN Final   WBC, Wet Prep HPF POC MODERATE (A) NONE  SEEN Final   Sperm NONE SEEN  Final     Labs: Basic Metabolic Panel:  Recent Labs Lab 02/21/17 1419 02/23/17 0632  NA 134* 134*  K 4.1 3.9  CL 96* 99*  CO2 26 26  GLUCOSE 384* 325*  BUN 9 7  CREATININE 0.87 0.66  CALCIUM 9.6 8.8*   Liver Function Tests:  Recent Labs Lab 02/21/17 1419  AST 19  ALT 23  ALKPHOS 68  BILITOT 0.7  PROT 8.1  ALBUMIN 3.9   No results for input(s): LIPASE, AMYLASE in the last 168 hours. No results for input(s): AMMONIA in the last 168 hours. CBC:  Recent Labs Lab 02/21/17 1419 02/23/17 0632  WBC 11.5* 5.6  NEUTROABS 7.8*  --   HGB 15.9* 12.9  HCT 46.7* 40.1  MCV 84.6 85.0  PLT 312 298   Cardiac Enzymes: No results for input(s): CKTOTAL, CKMB, CKMBINDEX, TROPONINI in the last 168 hours. BNP: BNP (last 3 results) No results for input(s): BNP in the last 8760 hours.  ProBNP (last 3 results) No results for input(s): PROBNP in the last 8760 hours.  CBG:  Recent Labs Lab 02/22/17 [id number] 02/22/17 1218 02/22/17 1637 02/22/17 2209 02/23/17  0600  GLUCAP 320* 283* 265* 375* 383*       Signed:  Edsel Petrin  [facility] 02/23/2017, 10:27 AM

## 2017-02-23 NOTE — Discharge Instructions (Signed)

## 2017-02-24 LAB — HEMOGLOBIN A1C
HEMOGLOBIN A1C: 11.3 % — AB (ref 4.8–5.6)
MEAN PLASMA GLUCOSE: 278 mg/dL

## 2017-02-26 LAB — CULTURE, BLOOD (ROUTINE X 2)
CULTURE: NO GROWTH
Culture: NO GROWTH

## 2017-04-15 ENCOUNTER — Observation Stay (HOSPITAL_COMMUNITY): Payer: Medicare Other

## 2017-04-15 ENCOUNTER — Inpatient Hospital Stay (HOSPITAL_COMMUNITY)
Admission: EM | Admit: 2017-04-15 | Discharge: 2017-04-17 | DRG: 872 | Disposition: A | Discharge status: Home / Self Care | Payer: Medicare Other | Attending: Family Medicine | Admitting: Family Medicine

## 2017-04-15 ENCOUNTER — Encounter (HOSPITAL_COMMUNITY): Payer: Self-pay | Admitting: Emergency Medicine

## 2017-04-15 DIAGNOSIS — Z79899 Other long term (current) drug therapy: Secondary | ICD-10-CM

## 2017-04-15 DIAGNOSIS — E1159 Type 2 diabetes mellitus with other circulatory complications: Secondary | ICD-10-CM | POA: Diagnosis present

## 2017-04-15 DIAGNOSIS — K219 Gastro-esophageal reflux disease without esophagitis: Secondary | ICD-10-CM | POA: Diagnosis present

## 2017-04-15 DIAGNOSIS — R739 Hyperglycemia, unspecified: Secondary | ICD-10-CM

## 2017-04-15 DIAGNOSIS — E669 Obesity, unspecified: Secondary | ICD-10-CM | POA: Diagnosis present

## 2017-04-15 DIAGNOSIS — E1165 Type 2 diabetes mellitus with hyperglycemia: Secondary | ICD-10-CM | POA: Diagnosis not present

## 2017-04-15 DIAGNOSIS — A419 Sepsis, unspecified organism: Secondary | ICD-10-CM

## 2017-04-15 DIAGNOSIS — Z886 Allergy status to analgesic agent status: Secondary | ICD-10-CM

## 2017-04-15 DIAGNOSIS — R319 Hematuria, unspecified: Secondary | ICD-10-CM | POA: Diagnosis present

## 2017-04-15 DIAGNOSIS — IMO0001 Reserved for inherently not codable concepts without codable children: Secondary | ICD-10-CM

## 2017-04-15 DIAGNOSIS — IMO0002 Reserved for concepts with insufficient information to code with codable children: Secondary | ICD-10-CM

## 2017-04-15 DIAGNOSIS — Z6833 Body mass index (BMI) 33.0-33.9, adult: Secondary | ICD-10-CM

## 2017-04-15 DIAGNOSIS — K5900 Constipation, unspecified: Secondary | ICD-10-CM

## 2017-04-15 DIAGNOSIS — G8929 Other chronic pain: Secondary | ICD-10-CM

## 2017-04-15 DIAGNOSIS — Z7984 Long term (current) use of oral hypoglycemic drugs: Secondary | ICD-10-CM

## 2017-04-15 DIAGNOSIS — N390 Urinary tract infection, site not specified: Secondary | ICD-10-CM | POA: Diagnosis not present

## 2017-04-15 DIAGNOSIS — K5903 Drug induced constipation: Secondary | ICD-10-CM | POA: Diagnosis not present

## 2017-04-15 DIAGNOSIS — A4151 Sepsis due to Escherichia coli [E. coli]: Principal | ICD-10-CM | POA: Diagnosis present

## 2017-04-15 DIAGNOSIS — B9620 Unspecified Escherichia coli [E. coli] as the cause of diseases classified elsewhere: Secondary | ICD-10-CM | POA: Diagnosis present

## 2017-04-15 DIAGNOSIS — I10 Essential (primary) hypertension: Secondary | ICD-10-CM | POA: Diagnosis present

## 2017-04-15 DIAGNOSIS — Z881 Allergy status to other antibiotic agents status: Secondary | ICD-10-CM

## 2017-04-15 DIAGNOSIS — R7881 Bacteremia: Secondary | ICD-10-CM | POA: Diagnosis present

## 2017-04-15 DIAGNOSIS — H409 Unspecified glaucoma: Secondary | ICD-10-CM | POA: Diagnosis present

## 2017-04-15 DIAGNOSIS — F411 Generalized anxiety disorder: Secondary | ICD-10-CM | POA: Diagnosis present

## 2017-04-15 DIAGNOSIS — F17290 Nicotine dependence, other tobacco product, uncomplicated: Secondary | ICD-10-CM | POA: Diagnosis present

## 2017-04-15 DIAGNOSIS — E11649 Type 2 diabetes mellitus with hypoglycemia without coma: Secondary | ICD-10-CM | POA: Diagnosis present

## 2017-04-15 HISTORY — DX: Constipation, unspecified: K59.00

## 2017-04-15 HISTORY — DX: Other chronic pain: G89.29

## 2017-04-15 LAB — BASIC METABOLIC PANEL
Anion gap: 12 (ref 5–15)
BUN: 13 mg/dL (ref 6–20)
CHLORIDE: 88 mmol/L — AB (ref 101–111)
CO2: 26 mmol/L (ref 22–32)
CREATININE: 0.82 mg/dL (ref 0.44–1.00)
Calcium: 9.5 mg/dL (ref 8.9–10.3)
GFR calc Af Amer: >60 mL/min (ref >60)
GFR calc non Af Amer: >60 mL/min (ref >60)
Glucose, Bld: 564 mg/dL (ref 65–99)
Potassium: 4.3 mmol/L (ref 3.5–5.1)
SODIUM: 126 mmol/L — AB (ref 135–145)

## 2017-04-15 LAB — URINALYSIS, ROUTINE W REFLEX MICROSCOPIC
Bilirubin Urine: NEGATIVE
KETONES UR: 5 mg/dL — AB
Nitrite: NEGATIVE
PROTEIN: 100 mg/dL — AB
Specific Gravity, Urine: 1.025 (ref 1.005–1.030)
pH: 5 (ref 5.0–8.0)

## 2017-04-15 LAB — GLUCOSE, CAPILLARY: Glucose-Capillary: 343 mg/dL — ABNORMAL HIGH (ref 65–99)

## 2017-04-15 LAB — I-STAT CG4 LACTIC ACID, ED
Lactic Acid, Venous: 3.08 mmol/L (ref 0.5–1.9)
Lactic Acid, Venous: 1.75 mmol/L (ref 0.5–1.9)

## 2017-04-15 LAB — CBC
HCT: 45.7 % (ref 36.0–46.0)
Hemoglobin: 15.6 g/dL — ABNORMAL HIGH (ref 12.0–15.0)
MCH: 28.2 pg (ref 26.0–34.0)
MCHC: 34.1 g/dL (ref 30.0–36.0)
MCV: 82.6 fL (ref 78.0–100.0)
PLATELETS: 333 10*3/uL (ref 150–400)
RBC: 5.53 MIL/uL — ABNORMAL HIGH (ref 3.87–5.11)
RDW: 13.8 % (ref 11.5–15.5)
WBC: 10.2 10*3/uL (ref 4.0–10.5)

## 2017-04-15 LAB — CBG MONITORING, ED
GLUCOSE-CAPILLARY: 339 mg/dL — AB (ref 65–99)
Glucose-Capillary: 454 mg/dL — ABNORMAL HIGH (ref 65–99)
Glucose-Capillary: 412 mg/dL — ABNORMAL HIGH (ref 65–99)

## 2017-04-15 MED ORDER — MORPHINE SULFATE (PF) 4 MG/ML IV SOLN
4.0000 mg | Freq: Once | INTRAVENOUS | Status: AC
  Administered 2017-04-15: 4 mg via INTRAVENOUS
  Filled 2017-04-15: qty 1

## 2017-04-15 MED ORDER — MONTELUKAST SODIUM 10 MG PO TABS
10.0000 mg | ORAL_TABLET | Freq: Every day | ORAL | Status: DC
  Administered 2017-04-15 – 2017-04-16 (×2): 10 mg via ORAL
  Filled 2017-04-15 (×3): qty 1

## 2017-04-15 MED ORDER — SODIUM CHLORIDE 0.9 % IV SOLN
INTRAVENOUS | Status: AC
  Administered 2017-04-15: 22:00:00 via INTRAVENOUS

## 2017-04-15 MED ORDER — SODIUM CHLORIDE 0.9% FLUSH
3.0000 mL | Freq: Two times a day (BID) | INTRAVENOUS | Status: DC
  Administered 2017-04-15 – 2017-04-17 (×3): 3 mL via INTRAVENOUS

## 2017-04-15 MED ORDER — DEXTROSE 5 % IV SOLN
1.0000 g | Freq: Once | INTRAVENOUS | Status: AC
  Administered 2017-04-15: 1 g via INTRAVENOUS
  Filled 2017-04-15: qty 10

## 2017-04-15 MED ORDER — INSULIN ASPART 100 UNIT/ML ~~LOC~~ SOLN
0.0000 [IU] | Freq: Every day | SUBCUTANEOUS | Status: DC
  Administered 2017-04-15: 4 [IU] via SUBCUTANEOUS
  Administered 2017-04-16: 2 [IU] via SUBCUTANEOUS

## 2017-04-15 MED ORDER — LATANOPROST 0.005 % OP SOLN
1.0000 [drp] | Freq: Every day | OPHTHALMIC | Status: DC
  Administered 2017-04-15 – 2017-04-16 (×2): 1 [drp] via OPHTHALMIC
  Filled 2017-04-15: qty 2.5

## 2017-04-15 MED ORDER — PANTOPRAZOLE SODIUM 40 MG PO TBEC
40.0000 mg | DELAYED_RELEASE_TABLET | Freq: Every day | ORAL | Status: DC
  Administered 2017-04-15 – 2017-04-17 (×3): 40 mg via ORAL
  Filled 2017-04-15 (×3): qty 1

## 2017-04-15 MED ORDER — INSULIN ASPART 100 UNIT/ML ~~LOC~~ SOLN
10.0000 [IU] | Freq: Once | SUBCUTANEOUS | Status: AC
  Administered 2017-04-15: 10 [IU] via INTRAVENOUS
  Filled 2017-04-15: qty 1

## 2017-04-15 MED ORDER — ALPRAZOLAM 0.5 MG PO TABS
0.2500 mg | ORAL_TABLET | Freq: Three times a day (TID) | ORAL | Status: DC | PRN
  Administered 2017-04-16: 0.5 mg via ORAL
  Filled 2017-04-15 (×2): qty 1

## 2017-04-15 MED ORDER — INSULIN ASPART 100 UNIT/ML ~~LOC~~ SOLN
0.0000 [IU] | Freq: Three times a day (TID) | SUBCUTANEOUS | Status: DC
  Administered 2017-04-16: 11 [IU] via SUBCUTANEOUS
  Administered 2017-04-16 (×2): 15 [IU] via SUBCUTANEOUS
  Administered 2017-04-17: 7 [IU] via SUBCUTANEOUS

## 2017-04-15 MED ORDER — POLYETHYLENE GLYCOL 3350 17 G PO PACK
17.0000 g | PACK | Freq: Every day | ORAL | Status: DC | PRN
  Administered 2017-04-16: 17 g via ORAL
  Filled 2017-04-15: qty 1

## 2017-04-15 MED ORDER — SODIUM CHLORIDE 0.9 % IV BOLUS (SEPSIS)
1000.0000 mL | Freq: Once | INTRAVENOUS | Status: AC
  Administered 2017-04-15: 1000 mL via INTRAVENOUS

## 2017-04-15 MED ORDER — OLOPATADINE HCL 0.1 % OP SOLN
1.0000 [drp] | Freq: Every day | OPHTHALMIC | Status: DC
  Administered 2017-04-16 – 2017-04-17 (×2): 1 [drp] via OPHTHALMIC
  Filled 2017-04-15: qty 5

## 2017-04-15 MED ORDER — ACETAMINOPHEN 325 MG PO TABS: 650.0000 mg | ORAL_TABLET | Freq: Four times a day (QID) | ORAL | Status: DC | PRN

## 2017-04-15 MED ORDER — ONDANSETRON HCL 4 MG PO TABS: 4.0000 mg | ORAL_TABLET | Freq: Four times a day (QID) | ORAL | Status: DC | PRN

## 2017-04-15 MED ORDER — HYDROCODONE-ACETAMINOPHEN 10-325 MG PO TABS
1.0000 | ORAL_TABLET | Freq: Four times a day (QID) | ORAL | Status: DC | PRN
  Administered 2017-04-15 – 2017-04-17 (×4): 1 via ORAL
  Filled 2017-04-15 (×4): qty 1

## 2017-04-15 MED ORDER — CYCLOBENZAPRINE HCL 10 MG PO TABS
10.0000 mg | ORAL_TABLET | Freq: Two times a day (BID) | ORAL | Status: DC | PRN
  Administered 2017-04-15 – 2017-04-16 (×2): 10 mg via ORAL
  Filled 2017-04-15 (×2): qty 1

## 2017-04-15 MED ORDER — ACETAMINOPHEN 650 MG RE SUPP: 650.0000 mg | Freq: Four times a day (QID) | RECTAL | Status: DC | PRN

## 2017-04-15 MED ORDER — SENNA 8.6 MG PO TABS
1.0000 | ORAL_TABLET | Freq: Two times a day (BID) | ORAL | Status: DC
  Administered 2017-04-15 – 2017-04-17 (×4): 8.6 mg via ORAL
  Filled 2017-04-15 (×5): qty 1

## 2017-04-15 MED ORDER — ALBUTEROL SULFATE (2.5 MG/3ML) 0.083% IN NEBU: 2.5000 mg | INHALATION_SOLUTION | RESPIRATORY_TRACT | Status: DC | PRN

## 2017-04-15 MED ORDER — ACETAMINOPHEN 325 MG PO TABS
650.0000 mg | ORAL_TABLET | Freq: Once | ORAL | Status: AC
  Administered 2017-04-15: 650 mg via ORAL
  Filled 2017-04-15: qty 2

## 2017-04-15 MED ORDER — DEXTROSE 5 % IV SOLN: 1.0000 g | Freq: Once | INTRAVENOUS | Status: DC

## 2017-04-15 MED ORDER — LISINOPRIL 20 MG PO TABS
20.0000 mg | ORAL_TABLET | Freq: Every day | ORAL | Status: DC
  Administered 2017-04-16 – 2017-04-17 (×2): 20 mg via ORAL
  Filled 2017-04-15 (×2): qty 1

## 2017-04-15 MED ORDER — ONDANSETRON HCL 4 MG/2ML IJ SOLN: 4.0000 mg | Freq: Four times a day (QID) | INTRAMUSCULAR | Status: DC | PRN

## 2017-04-15 NOTE — Progress Notes (Signed)
Pharmacy Antibiotic Note Sara Juarez is a 56 y.o. female admitted on 04/15/2017 with UTI.  Pharmacy has been consulted for Ceftriaxone dosing.  Plan: 1. Ceftriaxone 1 gram IV every 24 hours 2. Will follow peripherally  Height: 5' 4.5" (163.8 cm) Weight: 200 lb (90.7 kg) IBW/kg (Calculated) : 55.85  Temp (24hrs), Avg:99.6 F (37.6 C), Min:98.6 F (37 C), Max:100.4 F (38 C)   Recent Labs Lab 04/15/17 1224 04/15/17 1545 04/15/17 1840  WBC 10.2  --   --   CREATININE 0.82  --   --   LATICACIDVEN  --  3.08* 1.75    Estimated Creatinine Clearance: 84.4 mL/min (by C-G formula based on SCr of 0.82 mg/dL).    Allergies  Allergen Reactions  . Erythromycin Other (See Comments)    Other reaction(s): CRAMPS  . Aspirin Itching and Rash    Other reaction(s): RASH   Antimicrobials this admission: 4/27 Ceftriaxone >>   Microbiology results: 4/27 UCx: px   Thank you for allowing pharmacy to be a part of this patient's care.  Pollyann Samples, PharmD, BCPS 04/15/2017, 8:15 PM

## 2017-04-15 NOTE — ED Notes (Signed)
MD at bedside. 

## 2017-04-15 NOTE — H&P (Signed)
History and Physical    Sara Juarez YQM:[id number] DOB: Jun 16, 1961 DOA: 04/15/2017  PCP: [name]   Patient coming from: [facility]  Chief Complaint: Fever and urinary frequency  HPI: Sara Juarez is a 56 y.o. woman with a history of HTN and uncontrolled Type 2 DM, GERD, obesity, anxiety, and glaucoma who was referred to the ED by her PCP for evaluation for sepsis in the setting of LUTS.  The patient has a history of complicated E. Coli UTI/Acute pyelonephritis and required admission to the hospital in March.  For the past three days, she has experienced recurrent fever (to 101 at home) with urinary frequency.  She denies dysuria.  She has had chills and sweats.  She has had nausea but no vomiting.  She has had pelvic pain, but no CVA tenderness.  No syncope.  She has chronic neck and back pain, chronic muscle spasms, and intermittent chest pain that she relates to muscular origin.  She has intermittent shortness of breath that she treats with an albuterol inhaler prn.  ED Course: T 100.4.  HR 110.  LA 3.08.  Sodium 126.  Glucose 564.  WBC count 10.2.  Hgb 15.6.  Platelet count 333. U/A is positive for glucose, protein, ketones, large leukocytes, TNTC WBC, few bacteria, and mucous.  Negative for nitrites.  She has received 2L of NS, IV morphine for pain, IV insulin 10units, Rocephin, and acetaminophen for fever.  Hospitalist asked to admit.  Review of Systems: History of constipation, which she attributes to chronic pain medications.  Otherwise, 10 systems reviewed and negative except as stated in the HPI.   Past Medical History:  Diagnosis Date  . Anxiety   . Cervical herniated disc   . Diabetes mellitus without complication (HCC)   . Glaucoma (increased eye pressure)   . HTN (hypertension) 02/21/2017    Past Surgical History:  Procedure Laterality Date  . BILATERAL CARPAL TUNNEL RELEASE    . BLADDER SUSPENSION    . NASAL SINUS SURGERY    . PARTIAL  HYSTERECTOMY       reports that she has been smoking.  She has never used smokeless tobacco. She reports that she does not drink alcohol or use drugs.  She smokes cigars occasionally; she denies cigarette use.   She is married with adult children.  She considers her son her next of kin/first contact.  Allergies  Allergen Reactions  . Erythromycin Other (See Comments)    Other reaction(s): CRAMPS  . Aspirin Itching and Rash    Other reaction(s): RASH    Family History  Problem Relation Age of Onset  . Hypertension Mother   . Diabetes Mother   . Hypertension Father   . Diabetes Father   . Colon cancer Father   Multiple siblings have mental health issues.   Prior to Admission medications   Medication Sig Start Date End Date Taking? Authorizing Provider  ALPRAZolam [name]) 0.5 MG tablet Take 0.25-0.5 mg by mouth 3 (three) times daily as needed for anxiety.  08/07/16  Yes Historical Provider, MD  cetirizine (ZYRTEC) 10 MG tablet Take 10 mg by mouth daily.   Yes Historical Provider, MD  cyclobenzaprine (FLEXERIL) 10 MG tablet Take 1 tablet (10 mg total) by mouth 2 (two) times daily as needed for muscle spasms. 09/07/16  Yes [name], PA-C  fluticasone (FLONASE) 50 MCG/ACT nasal spray Place 1 spray into both nostrils daily as needed for allergies. 06/16/16  Yes Historical Provider, MD  HYDROcodone-acetaminophen (NORCO) 10-325 MG tablet Take 1 tablet by mouth every 6 (six) hours as needed for pain. 07/08/16  Yes Historical Provider, MD  lisinopril (PRINIVIL,ZESTRIL) 20 MG tablet Take 20 mg by mouth daily.   Yes Historical Provider, MD  LUMIGAN 0.01 % SOLN Place 1 drop into both eyes every evening. 06/04/16  Yes Historical Provider, MD  metFORMIN (GLUCOPHAGE) 500 MG tablet Take 500 mg by mouth 2 (two) times daily with a meal.   Yes Historical Provider, MD  omeprazole (PRILOSEC) 20 MG capsule Take 20 mg by mouth 2 (two) times daily before a meal.  07/08/16  Yes Historical Provider,  MD  PAZEO 0.7 % SOLN Place 1 drop into both eyes daily. 06/16/16  Yes Historical Provider, MD  PROAIR HFA 108 (90 Base) MCG/ACT inhaler Inhale 1 puff into the lungs every 6 (six) hours as needed for wheezing. 06/08/16  Yes Historical Provider, MD  triamcinolone cream (KENALOG) 0.1 % Apply 1 application topically daily. 01/14/17  Yes Historical Provider, MD  Vitamin D, Ergocalciferol, (DRISDOL) 50000 units CAPS capsule Take 50,000 Units by mouth every Tuesday. 02/09/17  Yes Historical Provider, MD  zafirlukast (ACCOLATE) 20 MG tablet Take 20 mg by mouth every evening. 06/16/16  Yes Historical Provider, MD  amoxicillin-clavulanate (AUGMENTIN) 875-125 MG tablet Take 1 tablet by mouth 2 (two) times daily. Patient not taking: Reported on 04/15/2017 02/23/17   Nita Sells Mikhail, DO  fluconazole (DIFLUCAN) 150 MG tablet Take 1 tablet (150 mg total) by mouth daily. Take after you complete antibiotics. Patient not taking: Reported on 04/15/2017 02/23/17   Edsel Petrin, DO    Physical Exam: Vitals:   04/15/17 1900 04/15/17 1915 04/15/17 1948 04/15/17 2003  BP: 133/85 (!) 152/94 (!) 164/97   Pulse:   (!) 101   Resp: 17 (!) 28 (!) 29   Temp:    99.9 F (37.7 C)  TempSrc:    Oral  SpO2:   (!) 84%   Weight:      Height:          Constitutional: NAD, calm, comfortable Vitals:   04/15/17 1900 04/15/17 1915 04/15/17 1948 04/15/17 2003  BP: 133/85 (!) 152/94 (!) 164/97   Pulse:   (!) 101   Resp: 17 (!) 28 (!) 29   Temp:    99.9 F (37.7 C)  TempSrc:    Oral  SpO2:   (!) 84%   Weight:      Height:       Eyes: PERRL, lids and conjunctivae normal ENMT: Mucous membranes are slightly dry. Posterior pharynx clear of any exudate or lesions. Normal dentition.  Neck: normal appearance, supple, no masses Respiratory: clear to auscultation bilaterally, no wheezing, no crackles. Normal respiratory effort. No accessory muscle use.  Cardiovascular: Normal rate, regular rhythm, no murmurs / rubs / gallops. No  extremity edema. 2+ pedal pulses. GI: abdomen is mildly distended but compressible.  No tenderness.  No guarding.  Bowel sounds are present. Musculoskeletal:  No joint deformity in upper and lower extremities. Good ROM, no contractures. Normal muscle tone.  Skin: no rashes, warm and dry Neurologic: Sensation intact, Strength symmetric bilaterally. Psychiatric: Normal judgment and insight. Alert and oriented x 3. Normal mood.     Labs on Admission: I have personally reviewed following labs and imaging studies  CBC:  Recent Labs Lab 04/15/17 1224  WBC 10.2  HGB 15.6*  HCT 45.7  MCV 82.6  PLT 333   Basic Metabolic Panel:  Recent Labs Lab 04/15/17 1224  NA 126*  K 4.3  CL 88*  CO2 26  GLUCOSE 564*  BUN 13  CREATININE 0.82  CALCIUM 9.5   GFR: Estimated Creatinine Clearance: 84.4 mL/min (by C-G formula based on SCr of 0.82 mg/dL).  CBG:  Recent Labs Lab 04/15/17 1507 04/15/17 1651 04/15/17 1945  GLUCAP 454* 412* 339*   Urine analysis:    Component Value Date/Time   COLORURINE STRAW (A) 04/15/2017 1555   APPEARANCEUR CLOUDY (A) 04/15/2017 1555   LABSPEC 1.025 04/15/2017 1555   PHURINE 5.0 04/15/2017 1555   GLUCOSEU >=500 (A) 04/15/2017 1555   HGBUR SMALL (A) 04/15/2017 1555   BILIRUBINUR NEGATIVE 04/15/2017 1555   KETONESUR 5 (A) 04/15/2017 1555   PROTEINUR 100 (A) 04/15/2017 1555   NITRITE NEGATIVE 04/15/2017 1555   LEUKOCYTESUR LARGE (A) 04/15/2017 1555   Sepsis Labs:  Lactic acid 3.08, repeat 1.75  Radiological Exams on Admission: Dg Chest Port 1 View  Result Date: 04/15/2017 CLINICAL DATA:  Sepsis. EXAM: PORTABLE CHEST 1 VIEW COMPARISON:  February 21, 2017 FINDINGS: The heart size and mediastinal contours are within normal limits. Both lungs are clear. The visualized skeletal structures are unremarkable. IMPRESSION: No active disease. Electronically Signed   By: Gerome Sam III M.D   On: 04/15/2017 21:03    Assessment/Plan Principal Problem:    Sepsis secondary to UTI [facility]) Active Problems:   HTN (hypertension)   DM (diabetes mellitus), type 2, uncontrolled (HCC)   Chronic pain   Constipation      Early sepsis secondary to UTI --Rocephin per pharmacy --NS at 75cc/hr --Blood and urine cultures sent in the ED --Will check procalcitonin level  Uncontrolled type 2 DM --Hold metformin --Aggressive sliding scale with HS coverage --Carb controlled diet --She would benefit from diabetic education  HTN --Lisinopril  GERD --PPI  Anxiety --Xanax prn  Chronic pain --Flexeril, lortab  Glaucoma --Lumigan, Pazeo eyedrops  Constipation --Bowel regimen per patient request   DVT prophylaxis: Low risk, outpatient status Code Status: FULL Family Communication: Patient alone in the ED at time of admission. Disposition Plan: Expect she will go home at discharge. Consults called: NONE Admission status: Place in observation, telemetry monitoring.   TIME SPENT: 60 minutes   Jerene Bears MD [facility] Pager [phone]  If 7PM-7AM, please contact night-coverage [email or web address] Password [facility]  04/15/2017, 8:05 PM

## 2017-04-15 NOTE — ED Triage Notes (Signed)
Pt reports lower abdominal pain and urinary frequency for the past few days, was sent here by pcp, hx of sepsis from urinary infections. Pt a/ox4, resp e/u, nad.

## 2017-04-15 NOTE — ED Provider Notes (Signed)
[facility]-EMERGENCY DEPT Provider Note   CSN: [id number] Arrival date & time: 04/15/17  1212     History   Chief Complaint Chief Complaint  Patient presents with  . Urinary Frequency  . Abdominal Pain    HPI Gresia Isidoro Galeas is a 56 y.o. female.  HPI   56 year old female with a significant past medical history of type 2 diabetes presents today with suprapubic pain and fevers.  Patient notes that on 02/21/2017 approximately 18 days ago patient was admitted to the hospital secondary to sepsis and left-sided pyelonephritis.  Patient notes that she she improved after hospital discharge and was discharged home on Augmentin.  Patient notes that approximately 3 days ago she started to develop similar symptoms to previous with lower abdominal pain and heaviness in her bladder.  She notes pain radiating into her back bilateral.  Patient notes she is on chronic pain medication and reports that she took these at home with no significant improvement in her symptoms.  Patient notes she is a type II diabetic taking metformin, notes taking her medication today.   Past Medical History:  Diagnosis Date  . Anxiety   . Cervical herniated disc   . Diabetes mellitus without complication (HCC)   . Glaucoma (increased eye pressure)   . HTN (hypertension) 02/21/2017    Patient Active Problem List   Diagnosis Date Noted  . Pyelonephritis 02/21/2017  . Vaginal discharge 02/21/2017  . Sepsis (HCC) 02/21/2017  . DM2 (diabetes mellitus, type 2) (HCC) 02/21/2017  . HTN (hypertension) 02/21/2017    History reviewed. No pertinent surgical history.  OB History    No data available       Home Medications    Prior to Admission medications   Medication Sig Start Date End Date Taking? Authorizing Provider  ALPRAZolam [name]) 0.5 MG tablet Take 0.25-0.5 mg by mouth 3 (three) times daily as needed for anxiety.  08/07/16  Yes Historical Provider, MD  cetirizine (ZYRTEC) 10 MG tablet Take 10 mg by mouth  daily.   Yes Historical Provider, MD  cyclobenzaprine (FLEXERIL) 10 MG tablet Take 1 tablet (10 mg total) by mouth 2 (two) times daily as needed for muscle spasms. 09/07/16  Yes [name], PA-C  fluticasone (FLONASE) 50 MCG/ACT nasal spray Place 1 spray into both nostrils daily as needed for allergies. 06/16/16  Yes Historical Provider, MD  HYDROcodone-acetaminophen (NORCO) 10-325 MG tablet Take 1 tablet by mouth every 6 (six) hours as needed for pain. 07/08/16  Yes Historical Provider, MD  lisinopril (PRINIVIL,ZESTRIL) 20 MG tablet Take 20 mg by mouth daily.   Yes Historical Provider, MD  LUMIGAN 0.01 % SOLN Place 1 drop into both eyes every evening. 06/04/16  Yes Historical Provider, MD  metFORMIN (GLUCOPHAGE) 500 MG tablet Take 500 mg by mouth 2 (two) times daily with a meal.   Yes Historical Provider, MD  omeprazole (PRILOSEC) 20 MG capsule Take 20 mg by mouth 2 (two) times daily before a meal.  07/08/16  Yes Historical Provider, MD  PAZEO 0.7 % SOLN Place 1 drop into both eyes daily. 06/16/16  Yes Historical Provider, MD  PROAIR HFA 108 (90 Base) MCG/ACT inhaler Inhale 1 puff into the lungs every 6 (six) hours as needed for wheezing. 06/08/16  Yes Historical Provider, MD  triamcinolone cream (KENALOG) 0.1 % Apply 1 application topically daily. 01/14/17  Yes Historical Provider, MD  Vitamin D, Ergocalciferol, (DRISDOL) 50000 units CAPS capsule Take 50,000 Units by mouth every Tuesday. 02/09/17  Yes Historical  Provider, MD  zafirlukast (ACCOLATE) 20 MG tablet Take 20 mg by mouth every evening. 06/16/16  Yes Historical Provider, MD  amoxicillin-clavulanate (AUGMENTIN) 875-125 MG tablet Take 1 tablet by mouth 2 (two) times daily. Patient not taking: Reported on 04/15/2017 02/23/17   Nita Sells Mikhail, DO  fluconazole (DIFLUCAN) 150 MG tablet Take 1 tablet (150 mg total) by mouth daily. Take after you complete antibiotics. Patient not taking: Reported on 04/15/2017 02/23/17   Edsel Petrin, DO     Family History No family history on file.  Social History Social History  Substance Use Topics  . Smoking status: Current Every Day Smoker  . Smokeless tobacco: Never Used     Comment: Hookah - most days  . Alcohol use No     Allergies   Erythromycin and Aspirin   Review of Systems Review of Systems  All other systems reviewed and are negative.   Physical Exam Updated Vital Signs BP 107/74   Pulse (!) 110   Temp (!) 100.4 F (38 C) (Rectal)   Resp 17   Ht 5' 4.5" (1.638 m)   Wt 90.7 kg   SpO2 97%   BMI 33.80 kg/m   Physical Exam  Constitutional: She is oriented to person, place, and time. She appears well-developed and well-nourished.  HENT:  Head: Normocephalic and atraumatic.  Eyes: Conjunctivae are normal. Pupils are equal, round, and reactive to light. Right eye exhibits no discharge. Left eye exhibits no discharge. No scleral icterus.  Neck: Normal range of motion. No JVD present. No tracheal deviation present.  Pulmonary/Chest: Effort normal. No stridor.  Abdominal:  Suprapubic TTP  Neurological: She is alert and oriented to person, place, and time. Coordination normal.  Psychiatric: She has a normal mood and affect. Her behavior is normal. Judgment and thought content normal.  Nursing note and vitals reviewed.    ED Treatments / Results  Labs (all labs ordered are listed, but only abnormal results are displayed) Labs Reviewed  URINALYSIS, ROUTINE W REFLEX MICROSCOPIC - Abnormal; Notable for the following:       Result Value   Color, Urine STRAW (*)    APPearance CLOUDY (*)    Glucose, UA >=500 (*)    Hgb urine dipstick SMALL (*)    Ketones, ur 5 (*)    Protein, ur 100 (*)    Leukocytes, UA LARGE (*)    Bacteria, UA FEW (*)    Squamous Epithelial / LPF 0-5 (*)    All other components within normal limits  BASIC METABOLIC PANEL - Abnormal; Notable for the following:    Sodium 126 (*)    Chloride 88 (*)    Glucose, Bld 564 (*)    All  other components within normal limits  CBC - Abnormal; Notable for the following:    RBC 5.53 (*)    Hemoglobin 15.6 (*)    All other components within normal limits  CBG MONITORING, ED - Abnormal; Notable for the following:    Glucose-Capillary 454 (*)    All other components within normal limits  I-STAT CG4 LACTIC ACID, ED - Abnormal; Notable for the following:    Lactic Acid, Venous 3.08 (*)    All other components within normal limits  CBG MONITORING, ED - Abnormal; Notable for the following:    Glucose-Capillary 412 (*)    All other components within normal limits  CULTURE, BLOOD (ROUTINE X 2)  CULTURE, BLOOD (ROUTINE X 2)  URINE CULTURE  I-STAT CG4 LACTIC ACID, ED  EKG  EKG Interpretation None       Radiology No results found.  Procedures Procedures (including critical care time)  CRITICAL CARE Performed by: Thermon Leyland   Total critical care time: 35 minutes  Critical care time was exclusive of separately billable procedures and treating other patients.  Critical care was necessary to treat or prevent imminent or life-threatening deterioration.  Critical care was time spent personally by me on the following activities: development of treatment plan with patient and/or surrogate as well as nursing, discussions with consultants, evaluation of patient's response to treatment, examination of patient, obtaining history from patient or surrogate, ordering and performing treatments and interventions, ordering and review of laboratory studies, ordering and review of radiographic studies, pulse oximetry and re-evaluation of patient's condition.  Patient reassessed after initial fluids, improved with stable vital signs.  Medications Ordered in ED Medications  sodium chloride 0.9 % bolus 1,000 mL (0 mLs Intravenous Stopped 04/15/17 1642)  morphine 4 MG/ML injection 4 mg (4 mg Intravenous Given 04/15/17 1540)  insulin aspart (novoLOG) injection 10 Units (10 Units  Intravenous Given 04/15/17 1550)  acetaminophen (TYLENOL) tablet 650 mg (650 mg Oral Given 04/15/17 1713)  cefTRIAXone (ROCEPHIN) 1 g in dextrose 5 % 50 mL IVPB (1 g Intravenous New Bag/Given 04/15/17 1713)  sodium chloride 0.9 % bolus 1,000 mL (1,000 mLs Intravenous New Bag/Given 04/15/17 1713)     Initial Impression / Assessment and Plan / ED Course  I have reviewed the triage vital signs and the nursing notes.  Pertinent labs & imaging results that were available during my care of the patient were reviewed by me and considered in my medical decision making (see chart for details).     Final Clinical Impressions(s) / ED Diagnoses   Final diagnoses:  Sepsis, due to unspecified organism [facility])  Urinary tract infection with hematuria, site unspecified  Hyperglycemia    Labs: CBC, urinalysis, lactic acid, CBG  Consults: [facility]: Ceftriaxone, normal saline, morphine, Tylenol   Assessment/Plan:   56 year old female presents today with sepsis secondary to urinary tract infection.  She has a fever, tachypnea, tachycardia with a source of infection and elevated lactic acid at 3.08.  Patient is also hyperglycemic which could be contributing to her frequent urinary tract infections.  Urine is positive for large leukocytes.  She was recently hospitalized for the same at the beginning of March, her urine culture at that time was pansensitive and was treated with ceftriaxone with good improvement in symptoms.  Patient will be given fluids here, she has no signs of septic shock.  She was given insulin regular.  Patient will likely need observation for ongoing management.   New Prescriptions New Prescriptions   No medications on file     Eyvonne Mechanic, PA-C 04/15/17 1840    Derwood Kaplan, MD 04/16/17 [phone]

## 2017-04-15 NOTE — ED Notes (Signed)
Report attempted x 1

## 2017-04-15 NOTE — ED Notes (Signed)
Critical Lab results reported to Nurse.

## 2017-04-16 DIAGNOSIS — K5900 Constipation, unspecified: Secondary | ICD-10-CM | POA: Diagnosis present

## 2017-04-16 DIAGNOSIS — A419 Sepsis, unspecified organism: Secondary | ICD-10-CM | POA: Diagnosis not present

## 2017-04-16 DIAGNOSIS — R7881 Bacteremia: Secondary | ICD-10-CM | POA: Diagnosis not present

## 2017-04-16 DIAGNOSIS — N390 Urinary tract infection, site not specified: Secondary | ICD-10-CM | POA: Diagnosis present

## 2017-04-16 DIAGNOSIS — Z79899 Other long term (current) drug therapy: Secondary | ICD-10-CM | POA: Diagnosis not present

## 2017-04-16 DIAGNOSIS — F411 Generalized anxiety disorder: Secondary | ICD-10-CM | POA: Diagnosis present

## 2017-04-16 DIAGNOSIS — K219 Gastro-esophageal reflux disease without esophagitis: Secondary | ICD-10-CM | POA: Diagnosis present

## 2017-04-16 DIAGNOSIS — H409 Unspecified glaucoma: Secondary | ICD-10-CM | POA: Diagnosis present

## 2017-04-16 DIAGNOSIS — B9620 Unspecified Escherichia coli [E. coli] as the cause of diseases classified elsewhere: Secondary | ICD-10-CM | POA: Diagnosis present

## 2017-04-16 DIAGNOSIS — G8929 Other chronic pain: Secondary | ICD-10-CM | POA: Diagnosis present

## 2017-04-16 DIAGNOSIS — Z7984 Long term (current) use of oral hypoglycemic drugs: Secondary | ICD-10-CM | POA: Diagnosis not present

## 2017-04-16 DIAGNOSIS — F17290 Nicotine dependence, other tobacco product, uncomplicated: Secondary | ICD-10-CM | POA: Diagnosis present

## 2017-04-16 DIAGNOSIS — E1165 Type 2 diabetes mellitus with hyperglycemia: Secondary | ICD-10-CM | POA: Diagnosis present

## 2017-04-16 DIAGNOSIS — E669 Obesity, unspecified: Secondary | ICD-10-CM | POA: Diagnosis present

## 2017-04-16 DIAGNOSIS — I10 Essential (primary) hypertension: Secondary | ICD-10-CM | POA: Diagnosis present

## 2017-04-16 DIAGNOSIS — K5903 Drug induced constipation: Secondary | ICD-10-CM | POA: Diagnosis not present

## 2017-04-16 DIAGNOSIS — G894 Chronic pain syndrome: Secondary | ICD-10-CM | POA: Diagnosis not present

## 2017-04-16 DIAGNOSIS — Z6833 Body mass index (BMI) 33.0-33.9, adult: Secondary | ICD-10-CM | POA: Diagnosis not present

## 2017-04-16 DIAGNOSIS — Z881 Allergy status to other antibiotic agents status: Secondary | ICD-10-CM | POA: Diagnosis not present

## 2017-04-16 DIAGNOSIS — R319 Hematuria, unspecified: Secondary | ICD-10-CM | POA: Diagnosis present

## 2017-04-16 DIAGNOSIS — E11649 Type 2 diabetes mellitus with hypoglycemia without coma: Secondary | ICD-10-CM | POA: Diagnosis present

## 2017-04-16 DIAGNOSIS — Z886 Allergy status to analgesic agent status: Secondary | ICD-10-CM | POA: Diagnosis not present

## 2017-04-16 DIAGNOSIS — A4151 Sepsis due to Escherichia coli [E. coli]: Secondary | ICD-10-CM | POA: Diagnosis present

## 2017-04-16 LAB — CBC
HEMATOCRIT: 40.2 % (ref 36.0–46.0)
HEMOGLOBIN: 13.2 g/dL (ref 12.0–15.0)
MCH: 27.4 pg (ref 26.0–34.0)
MCHC: 32.8 g/dL (ref 30.0–36.0)
MCV: 83.6 fL (ref 78.0–100.0)
Platelets: 286 10*3/uL (ref 150–400)
RBC: 4.81 MIL/uL (ref 3.87–5.11)
RDW: 13.7 % (ref 11.5–15.5)
WBC: 10.6 10*3/uL — AB (ref 4.0–10.5)

## 2017-04-16 LAB — BLOOD CULTURE ID PANEL (REFLEXED)

## 2017-04-16 LAB — BASIC METABOLIC PANEL
ANION GAP: 12 (ref 5–15)
BUN: 8 mg/dL (ref 6–20)
CALCIUM: 8.4 mg/dL — AB (ref 8.9–10.3)
CHLORIDE: 100 mmol/L — AB (ref 101–111)
CO2: 20 mmol/L — AB (ref 22–32)
Creatinine, Ser: 0.75 mg/dL (ref 0.44–1.00)
GFR calc non Af Amer: >60 mL/min (ref >60)
Glucose, Bld: 362 mg/dL — ABNORMAL HIGH (ref 65–99)
Potassium: 4.3 mmol/L (ref 3.5–5.1)
Sodium: 132 mmol/L — ABNORMAL LOW (ref 135–145)

## 2017-04-16 LAB — GLUCOSE, CAPILLARY
GLUCOSE-CAPILLARY: 266 mg/dL — AB (ref 65–99)
Glucose-Capillary: 338 mg/dL — ABNORMAL HIGH (ref 65–99)
Glucose-Capillary: 306 mg/dL — ABNORMAL HIGH (ref 65–99)
Glucose-Capillary: 219 mg/dL — ABNORMAL HIGH (ref 65–99)

## 2017-04-16 LAB — PROCALCITONIN: Procalcitonin: 0.31 ng/mL

## 2017-04-16 MED ORDER — DEXTROSE 5 % IV SOLN
2.0000 g | INTRAVENOUS | Status: DC
  Administered 2017-04-16 – 2017-04-17 (×2): 2 g via INTRAVENOUS
  Filled 2017-04-16 (×2): qty 2

## 2017-04-16 MED ORDER — INSULIN GLARGINE 100 UNIT/ML ~~LOC~~ SOLN
45.0000 [IU] | SUBCUTANEOUS | Status: DC
  Administered 2017-04-16 – 2017-04-17 (×2): 45 [IU] via SUBCUTANEOUS
  Filled 2017-04-16 (×2): qty 0.45

## 2017-04-16 MED ORDER — ENOXAPARIN SODIUM 40 MG/0.4ML ~~LOC~~ SOLN
40.0000 mg | SUBCUTANEOUS | Status: DC
  Administered 2017-04-16: 40 mg via SUBCUTANEOUS
  Filled 2017-04-16: qty 0.4

## 2017-04-16 MED ORDER — LIVING WELL WITH DIABETES BOOK
Freq: Once | Status: AC
  Administered 2017-04-16: 18:00:00
  Filled 2017-04-16: qty 1

## 2017-04-16 MED ORDER — NICOTINE 21 MG/24HR TD PT24
21.0000 mg | MEDICATED_PATCH | Freq: Every day | TRANSDERMAL | Status: DC
  Filled 2017-04-16: qty 1

## 2017-04-16 MED ORDER — INSULIN ASPART 100 UNIT/ML ~~LOC~~ SOLN
18.0000 [IU] | Freq: Three times a day (TID) | SUBCUTANEOUS | Status: DC
  Administered 2017-04-16 – 2017-04-17 (×3): 18 [IU] via SUBCUTANEOUS

## 2017-04-16 MED ORDER — INSULIN ASPART 100 UNIT/ML ~~LOC~~ SOLN
14.0000 [IU] | Freq: Three times a day (TID) | SUBCUTANEOUS | Status: DC
  Administered 2017-04-16: 14 [IU] via SUBCUTANEOUS

## 2017-04-16 MED ORDER — SODIUM CHLORIDE 0.9 % IV SOLN
INTRAVENOUS | Status: AC
  Administered 2017-04-16: 13:00:00 via INTRAVENOUS

## 2017-04-16 NOTE — Progress Notes (Signed)
PROGRESS NOTE    Sara Juarez  RUE:[id number]  DOB: [birth year]  DOA: 04/15/2017 PCP: [name]:   Hospital course: HPI: Sara Juarez is a 56 y.o. woman with a history of HTN and uncontrolled Type 2 DM, GERD, obesity, anxiety, and glaucoma who was referred to the ED by her PCP for evaluation for sepsis in the setting of LUTS.  The patient has a history of complicated E. Coli UTI/Acute pyelonephritis and required admission to the hospital in March.  For the past three days, she has experienced recurrent fever (to 101 at home) with urinary frequency.  Pt admitted with E coli bacteremia and SIRS  Assessment & Plan:   1. Sepsis secondary to E coli Bacteremia and UTI - Continue ceftriaxone 2 gm IV daily, follow C&S results, working on hyperglycemia, will need 14 full days of antibiotics to treat this infection.  Get repeat blood cultures today.  2. Uncontrolled type 2 diabetes mellitus - poorly controlled as evidenced by A1c >11%.  Pt is willing to go on insulin.  Will begin education, likely will do best with 70/30 insulin 2 injections per day at discharge and to follow up with her diabetes provider at [facility].  Pt asked for endocrinology referral. In the hospital will start intensive basal bolus supplemental insulin program, Will start lantus 45 units plus novolog 14 units TIDAC plus supplemental coverage.  Likely could also continue metformin at discharge but maximize the dose.   3. Hypertension - blood pressures controlled, continue current therapy.  4. GERD - continue pantoprazole for GI protection.  5. GAD - continue home medications, stable.  6. Chronic pain - continue home medications, stable 7. Glaucoma - stable, continue home medications.  8. Constipation - senokot ordered BID  DVT prophylaxis: enoxaparin Code Status: full  Family Communication: husband at bedside Disposition Plan: home in 1-2 days  Subjective: Pt says that  she is noticing that she is feeling better.  No more chills.  No recent fever.    Objective: Vitals:   04/15/17 2022 04/15/17 2023 04/16/17 0534 04/16/17 0841  BP: (!) 151/87  (!) 137/91 (!) 128/94  Pulse: (!) 122 (!) 120 (!) 112   Resp: 18  18   Temp: 100.1 F (37.8 C)  99.1 F (37.3 C)   TempSrc: Oral  Oral   SpO2: (!) 82% 97% 95%   Weight:      Height:        Intake/Output Summary (Last 24 hours) at 04/16/17 1240 Last data filed at 04/16/17 1478  Gross per 24 hour  Intake             3330 ml  Output                0 ml  Net             3330 ml   Filed Weights   04/15/17 1715  Weight: 90.7 kg (200 lb)    Exam:  General exam: awake, alert, NAD. Cooperative. Nontoxic appearing.  Respiratory system: Clear. No increased work of breathing. Cardiovascular system: S1 & S2 heard, tachycardic. No JVD. Gastrointestinal system: Abdomen is nondistended, soft and nontender. Normal bowel sounds heard. Central nervous system: Alert and oriented. No focal neurological deficits. Extremities: no Cyanosis.  Data Reviewed: Basic Metabolic Panel:  Recent Labs Lab 04/15/17 1224 04/16/17 0504  NA 126* 132*  K 4.3 4.3  CL 88* 100*  CO2 26 20*  GLUCOSE 564* 362*  BUN  13 8  CREATININE 0.82 0.75  CALCIUM 9.5 8.4*   Liver Function Tests: No results for input(s): AST, ALT, ALKPHOS, BILITOT, PROT, ALBUMIN in the last 168 hours. No results for input(s): LIPASE, AMYLASE in the last 168 hours. No results for input(s): AMMONIA in the last 168 hours. CBC:  Recent Labs Lab 04/15/17 1224 04/16/17 0504  WBC 10.2 10.6*  HGB 15.6* 13.2  HCT 45.7 40.2  MCV 82.6 83.6  PLT 333 286   Cardiac Enzymes: No results for input(s): CKTOTAL, CKMB, CKMBINDEX, TROPONINI in the last 168 hours. CBG (last 3)   Recent Labs  04/15/17 2148 04/16/17 0802 04/16/17 1232  GLUCAP 343* 338* 306*   Recent Results (from the past 240 hour(s))  Blood culture (routine x 2)     Status: None (Preliminary  result)   Collection Time: 04/15/17  4:18 PM  Result Value Ref Range Status   Specimen Description BLOOD LEFT ANTECUBITAL  Final   Special Requests   Final    BOTTLES DRAWN AEROBIC AND ANAEROBIC Blood Culture adequate volume   Culture  Setup Time   Final    GRAM NEGATIVE RODS AEROBIC BOTTLE ONLY CRITICAL RESULT CALLED TO, READ BACK BY AND VERIFIED WITH: J SO,PHARMD AT 1610 04/16/17 BY L BENFIELD    Culture GRAM NEGATIVE RODS  Final   Report Status PENDING  Incomplete  Blood Culture ID Panel (Reflexed)     Status: Abnormal   Collection Time: 04/15/17  4:18 PM  Result Value Ref Range Status   Enterococcus species NOT DETECTED NOT DETECTED Final   Listeria monocytogenes NOT DETECTED NOT DETECTED Final   Staphylococcus species NOT DETECTED NOT DETECTED Final   Staphylococcus aureus NOT DETECTED NOT DETECTED Final   Streptococcus species NOT DETECTED NOT DETECTED Final   Streptococcus agalactiae NOT DETECTED NOT DETECTED Final   Streptococcus pneumoniae NOT DETECTED NOT DETECTED Final   Streptococcus pyogenes NOT DETECTED NOT DETECTED Final   Acinetobacter baumannii NOT DETECTED NOT DETECTED Final   Enterobacteriaceae species DETECTED (A) NOT DETECTED Final    Comment: Enterobacteriaceae represent a large family of gram-negative bacteria, not a single organism. CRITICAL RESULT CALLED TO, READ BACK BY AND VERIFIED WITH: J SO, PHARMD AT 9604 04/16/17 BY L BENFIELD    Enterobacter cloacae complex NOT DETECTED NOT DETECTED Final   Escherichia coli DETECTED (A) NOT DETECTED Final    Comment: CRITICAL RESULT CALLED TO, READ BACK BY AND VERIFIED WITH: J SO,PHARMD AT 5409 04/16/17 BY L BENFIELD    Klebsiella oxytoca NOT DETECTED NOT DETECTED Final   Klebsiella pneumoniae NOT DETECTED NOT DETECTED Final   Proteus species NOT DETECTED NOT DETECTED Final   Serratia marcescens NOT DETECTED NOT DETECTED Final   Carbapenem resistance NOT DETECTED NOT DETECTED Final   Haemophilus influenzae NOT  DETECTED NOT DETECTED Final   Neisseria meningitidis NOT DETECTED NOT DETECTED Final   Pseudomonas aeruginosa NOT DETECTED NOT DETECTED Final   Candida albicans NOT DETECTED NOT DETECTED Final   Candida glabrata NOT DETECTED NOT DETECTED Final   Candida krusei NOT DETECTED NOT DETECTED Final   Candida parapsilosis NOT DETECTED NOT DETECTED Final   Candida tropicalis NOT DETECTED NOT DETECTED Final     Studies: Dg Chest Port 1 View  Result Date: 04/15/2017 CLINICAL DATA:  Sepsis. EXAM: PORTABLE CHEST 1 VIEW COMPARISON:  February 21, 2017 FINDINGS: The heart size and mediastinal contours are within normal limits. Both lungs are clear. The visualized skeletal structures are unremarkable. IMPRESSION: No active disease. Electronically  Signed   By: Gerome Sam III M.D   On: 04/15/2017 21:03   Scheduled Meds: . insulin aspart  0-20 Units Subcutaneous TID WC  . insulin aspart  0-5 Units Subcutaneous QHS  . insulin aspart  18 Units Subcutaneous TID WC  . insulin glargine  45 Units Subcutaneous Q24H  . latanoprost  1 drop Both Eyes QHS  . lisinopril  20 mg Oral Daily  . montelukast  10 mg Oral QHS  . olopatadine  1 drop Both Eyes Daily  . pantoprazole  40 mg Oral Daily  . senna  1 tablet Oral BID  . sodium chloride flush  3 mL Intravenous Q12H   Continuous Infusions: . sodium chloride    . cefTRIAXone (ROCEPHIN)  IV Stopped (04/16/17 1042)    Principal Problem:   Sepsis secondary to UTI (HCC) Active Problems:   HTN (hypertension)   DM (diabetes mellitus), type 2, uncontrolled (HCC)   Chronic pain   Constipation   E coli bacteremia  Time spent:   Standley Dakins, MD, FAAFP [facility] Pager [phone]  If 7PM-7AM, please contact night-coverage [email or web address] Password [email or web address] 04/16/2017, 12:40 PM    LOS: 0 days

## 2017-04-16 NOTE — Progress Notes (Signed)
PHARMACY - PHYSICIAN COMMUNICATION CRITICAL VALUE ALERT - BLOOD CULTURE IDENTIFICATION (BCID)  Results for orders placed or performed during the hospital encounter of 04/15/17  Blood Culture ID Panel (Reflexed) (Collected: 04/15/2017  4:18 PM)  Result Value Ref Range   Enterococcus species NOT DETECTED NOT DETECTED   Listeria monocytogenes NOT DETECTED NOT DETECTED   Staphylococcus species NOT DETECTED NOT DETECTED   Staphylococcus aureus NOT DETECTED NOT DETECTED   Streptococcus species NOT DETECTED NOT DETECTED   Streptococcus agalactiae NOT DETECTED NOT DETECTED   Streptococcus pneumoniae NOT DETECTED NOT DETECTED   Streptococcus pyogenes NOT DETECTED NOT DETECTED   Acinetobacter baumannii NOT DETECTED NOT DETECTED   Enterobacteriaceae species DETECTED (A) NOT DETECTED   Enterobacter cloacae complex NOT DETECTED NOT DETECTED   Escherichia coli DETECTED (A) NOT DETECTED   Klebsiella oxytoca NOT DETECTED NOT DETECTED   Klebsiella pneumoniae NOT DETECTED NOT DETECTED   Proteus species NOT DETECTED NOT DETECTED   Serratia marcescens NOT DETECTED NOT DETECTED   Carbapenem resistance NOT DETECTED NOT DETECTED   Haemophilus influenzae NOT DETECTED NOT DETECTED   Neisseria meningitidis NOT DETECTED NOT DETECTED   Pseudomonas aeruginosa NOT DETECTED NOT DETECTED   Candida albicans NOT DETECTED NOT DETECTED   Candida glabrata NOT DETECTED NOT DETECTED   Candida krusei NOT DETECTED NOT DETECTED   Candida parapsilosis NOT DETECTED NOT DETECTED   Candida tropicalis NOT DETECTED NOT DETECTED    Name of physician (or Provider) Contacted: text paged Dr. Theodosia Quay  Changes to prescribed antibiotics required: increase ceftriaxone to 2 g IV q24h  Loura Back, PharmD, BCPS Clinical Pharmacist Phone for today [phone] Main pharmacy - [phone] 04/16/2017 8:45 AM

## 2017-04-16 NOTE — Plan of Care (Signed)
Problem: Food- and Nutrition-Related Knowledge Deficit (NB-1.1) Goal: Nutrition education Formal process to instruct or train a patient/client in a skill or to impart knowledge to help patients/clients voluntarily manage or modify food choices and eating behavior to maintain or improve health.  Outcome: Completed/Met Date Met: 04/16/17  RD consulted for nutrition education regarding diabetes.   Lab Results  Component Value Date   HGBA1C 11.3 (H) 02/23/2017   Spoke with patient at bedside. She reports she has known about her diabetes for about one year now. She reports she has been in denial and has not taken it seriously but she would like to learn how to manage her blood sugars now. She has only briefly met with a Dietitian in a group setting, but reports she did not put the recommendations into practice. Patient reports good appetite. Denies any unintentional weight loss. Usual intake is three meals per day. For breakfast she has cereal with milk. For lunch she has two hot dogs and a soda. For dinner she has beef stew or beef tips with barbecue sauce, cabbage or kale, corn on the cob, and corn bread, potato salad. She reports she also "binges" on ice cream, cookies, and cake at night after dinner, which she is going to try to stop doing. Her husband does not have DM and always asks if she wants a night time snack when he gets up to get one.  RD provided "Carbohydrate Counting for People with Diabetes" handout from the Academy of [employer] and Dietetics. Discussed different food groups and their effects on blood sugar, emphasizing carbohydrate-containing foods. Provided list of carbohydrates and recommended serving sizes of common foods.  Discussed importance of controlled and consistent carbohydrate intake throughout the day. Provided examples of ways to balance meals/snacks and encouraged intake of high-fiber, whole grain complex carbohydrates. Teach back method used.  Expect good compliance.  Patient requesting further education/counseling with RD. Made patient outpatient referral for education.  Body mass index is 33.8 kg/m. Pt meets criteria for Obesity Class I based on current BMI.  Current diet order is Carbohydrate Modified, patient is consuming approximately 100% of meals at this time. Labs and medications reviewed. No further nutrition interventions warranted at this time. RD contact information provided. If additional nutrition issues arise, please re-consult RD.  Willey Blade, MS, RD, LDN Pager: [phone] After Hours Pager: [phone]

## 2017-04-16 NOTE — Progress Notes (Signed)
Patient told many times she was unable to leave the unit without a doctor's order and that the order would not be given because she was leaving to smoke. Patient verbalized that she understood but that she was going to leave. Patient wheeled outside by husband to go smoke.

## 2017-04-17 DIAGNOSIS — E1165 Type 2 diabetes mellitus with hyperglycemia: Secondary | ICD-10-CM

## 2017-04-17 LAB — BASIC METABOLIC PANEL
ANION GAP: 9 (ref 5–15)
BUN: 6 mg/dL (ref 6–20)
CO2: 23 mmol/L (ref 22–32)
Calcium: 8.1 mg/dL — ABNORMAL LOW (ref 8.9–10.3)
Chloride: 102 mmol/L (ref 101–111)
Creatinine, Ser: 0.52 mg/dL (ref 0.44–1.00)
GLUCOSE: 214 mg/dL — AB (ref 65–99)
POTASSIUM: 3.6 mmol/L (ref 3.5–5.1)
Sodium: 134 mmol/L — ABNORMAL LOW (ref 135–145)

## 2017-04-17 LAB — CBC WITH DIFFERENTIAL/PLATELET
BASOS ABS: 0 10*3/uL (ref 0.0–0.1)
Basophils Relative: 0 %
Eosinophils Absolute: 0.1 10*3/uL (ref 0.0–0.7)
Eosinophils Relative: 2 %
HEMATOCRIT: 38.8 % (ref 36.0–46.0)
HEMOGLOBIN: 12.8 g/dL (ref 12.0–15.0)
LYMPHS PCT: 36 %
Lymphs Abs: 2.2 10*3/uL (ref 0.7–4.0)
MCH: 27.3 pg (ref 26.0–34.0)
MCHC: 33 g/dL (ref 30.0–36.0)
MCV: 82.7 fL (ref 78.0–100.0)
Monocytes Absolute: 0.8 10*3/uL (ref 0.1–1.0)
Monocytes Relative: 13 %
NEUTROS ABS: 3 10*3/uL (ref 1.7–7.7)
NEUTROS PCT: 49 %
Platelets: 275 10*3/uL (ref 150–400)
RBC: 4.69 MIL/uL (ref 3.87–5.11)
RDW: 13.7 % (ref 11.5–15.5)
WBC: 6.2 10*3/uL (ref 4.0–10.5)

## 2017-04-17 LAB — URINE CULTURE: Culture: >=100000 — AB

## 2017-04-17 LAB — HEMOGLOBIN A1C
Hgb A1c MFr Bld: 14.5 % — ABNORMAL HIGH (ref 4.8–5.6)
MEAN PLASMA GLUCOSE: 369 mg/dL

## 2017-04-17 LAB — GLUCOSE, CAPILLARY
GLUCOSE-CAPILLARY: 243 mg/dL — AB (ref 65–99)
Glucose-Capillary: 200 mg/dL — ABNORMAL HIGH (ref 65–99)

## 2017-04-17 MED ORDER — FLUCONAZOLE 150 MG PO TABS: 150.0000 mg | ORAL_TABLET | Freq: Once | ORAL | 0 refills | Status: DC | PRN

## 2017-04-17 MED ORDER — METFORMIN HCL ER 500 MG PO TB24: ORAL_TABLET | ORAL | 0 refills | Status: DC

## 2017-04-17 MED ORDER — INSULIN ASPART PROT & ASPART (70-30 MIX) 100 UNIT/ML PEN: PEN_INJECTOR | SUBCUTANEOUS | 0 refills | Status: DC

## 2017-04-17 MED ORDER — CIPROFLOXACIN HCL 500 MG PO TABS: 500.0000 mg | ORAL_TABLET | Freq: Two times a day (BID) | ORAL | 0 refills | Status: AC

## 2017-04-17 MED ORDER — INSULIN PEN NEEDLE 31G X 5 MM MISC: 1.0000 [IU] | 0 refills | Status: DC

## 2017-04-17 NOTE — Discharge Instructions (Signed)
Test blood glucose 4 times per day.  Take insulin Pen shot as directed. Take 35 units with breakfast and take 30 units with supper everyday. Do not take insulin if you do not eat. If you take your morning insulin shot you must eat lunch or your blood sugar could run low.  Check your blood sugar if you feel like your blood sugar is low at anytime.  Ask the pharmacist to help you with your insulin pen when you pick it up.  Also ask your primary care physician to assist you.   Stop smoking or using any nicotine products.   See your primary care provider in 2-3 days for recheck.   Seek medical care or Return if symptoms recur or new problem develops.

## 2017-04-17 NOTE — Progress Notes (Signed)
NURSING PROGRESS NOTE  CHEVIE BIRKHEAD [id number] Discharge Data: 04/17/2017 12:20 PM Attending Provider: Cleora Fleet, MD [id number], Loistine Simas A Stockley to be D/C'd Home per MD order.  Discussed with the patient the After Visit Summary and all questions fully answered. All IV's discontinued with no bleeding noted. All belongings returned to patient for patient to take home. Pt given written work note.   Last Vital Signs:  Blood pressure (!) 142/81, pulse (!) 101, temperature 98.6 F (37 C), temperature source Oral, resp. rate 18, height 5' 4.5" (1.638 m), weight 90.7 kg (200 lb), SpO2 (!) 89 %.  Discharge Medication List Allergies as of 04/17/2017      Reactions   Erythromycin Other (See Comments)   Other reaction(s): CRAMPS   Aspirin Itching, Rash   Other reaction(s): RASH      Medication List    STOP taking these medications   amoxicillin-clavulanate 875-125 MG tablet Commonly known as:  AUGMENTIN   metFORMIN 500 MG tablet Commonly known as:  GLUCOPHAGE Replaced by:  metFORMIN 500 MG 24 hr tablet     TAKE these medications   ALPRAZolam 0.5 MG tablet Commonly known as:  XANAX Take 0.25-0.5 mg by mouth 3 (three) times daily as needed for anxiety.   cetirizine 10 MG tablet Commonly known as:  ZYRTEC Take 10 mg by mouth daily.   ciprofloxacin 500 MG tablet Commonly known as:  CIPRO Take 1 tablet (500 mg total) by mouth 2 (two) times daily.   cyclobenzaprine 10 MG tablet Commonly known as:  FLEXERIL Take 1 tablet (10 mg total) by mouth 2 (two) times daily as needed for muscle spasms.   fluconazole 150 MG tablet Commonly known as:  DIFLUCAN Take 1 tablet (150 mg total) by mouth once as needed (yeast infection after antibiotics). Take after you complete antibiotics. What changed:  when to take this  reasons to take this   fluticasone 50 MCG/ACT nasal spray Commonly known as:  FLONASE Place 1 spray into both nostrils daily as needed for  allergies.   HYDROcodone-acetaminophen 10-325 MG tablet Commonly known as:  NORCO Take 1 tablet by mouth every 6 (six) hours as needed for pain.   insulin aspart protamine - aspart (70-30) 100 UNIT/ML FlexPen Commonly known as:  NOVOLOG 70/30 MIX Take 35 units with breakfast and take 30 units with supper everyday.   Insulin Pen Needle 31G X 5 MM Misc 1 Units by Does not apply route as directed.   lisinopril 20 MG tablet Commonly known as:  PRINIVIL,ZESTRIL Take 20 mg by mouth daily.   LUMIGAN 0.01 % Soln Generic drug:  bimatoprost Place 1 drop into both eyes every evening.   metFORMIN 500 MG 24 hr tablet Commonly known as:  GLUCOPHAGE XR Take 2 tabs with breakfast and 2 tabs with supper everyday Replaces:  metFORMIN 500 MG tablet   omeprazole 20 MG capsule Commonly known as:  PRILOSEC Take 20 mg by mouth 2 (two) times daily before a meal.   PAZEO 0.7 % Soln Generic drug:  Olopatadine HCl Place 1 drop into both eyes daily.   PROAIR HFA 108 (90 Base) MCG/ACT inhaler Generic drug:  albuterol Inhale 1 puff into the lungs every 6 (six) hours as needed for wheezing.   triamcinolone cream 0.1 % Commonly known as:  KENALOG Apply 1 application topically daily.   Vitamin D (Ergocalciferol) 50000 units Caps capsule Commonly known as:  DRISDOL Take 50,000 Units by mouth every Tuesday.  zafirlukast 20 MG tablet Commonly known as:  ACCOLATE Take 20 mg by mouth every evening.

## 2017-04-17 NOTE — Care Management Note (Signed)
Case Management Note  Patient Details  Name: Sara Juarez MRN: [id number] Date of Birth: 03-26-1961  Subjective/Objective:     Pt admitted with fever and urinary frequency               Action/Plan:   PTA from home independent.  Pt confirms she has PCP and denied barriers to obtaining prescriptions and needed DM equipment.  CM consulted to set up follow up with endo doctor for DM management - CM informed bedside Nurse that unit or attending makes consult appts for specialist - CM referred bedside nurse to attending with questions.   Expected Discharge Date:  04/17/17               Expected Discharge Plan:  Home/Self Care  In-House Referral:     Discharge planning Services  CM Consult  Post Acute Care Choice:    Choice offered to:     DME Arranged:    DME Agency:     HH Arranged:    HH Agency:     Status of Service:  Completed, signed off  If discussed at [employer] of Stay Meetings, dates discussed:    Additional Comments:  Cherylann Parr, RN 04/17/2017, 12:09 PM

## 2017-04-17 NOTE — Discharge Summary (Signed)
Physician Discharge Summary  Sara Juarez KWI:[id number] DOB: February 26, 1961 DOA: 04/15/2017  PCP: Marry Guan  Admit date: 04/15/2017 Discharge date: 04/17/2017  Admitted From: Home  Disposition:  Home   Recommendations for Outpatient Follow-up:  1. Follow up with PCP in 3 to 5 days  2. Please obtain BMP/CBC in one week 3. Please follow up on the following pending results: final culture results 4. Please consider prescribing a glucagon emergency kit for patient to have available at home  5. Please assist patient with referral to an endocrinologist for diabetes management.   Test blood glucose 4 times per day.  Take insulin Pen shot as directed. Take 35 units with breakfast and take 30 units with supper everyday. Do not take insulin if you do not eat. If you take your morning insulin shot you must eat lunch or your blood sugar could run low.  Check your blood sugar if you feel like your blood sugar is low at anytime.  Ask the pharmacist to help you with your insulin pen when you pick it up.  Also ask your primary care physician to assist you.   Stop smoking or using any nicotine products.   See your primary care provider in 2-3 days for recheck.   Seek medical care or Return if symptoms recur or new problem develops.  Ask your primary care provider about a glucagon emergency kit for home use.  Discharge Condition: STABLE  CODE STATUS: FULL  Diet recommendation: Heart Healthy / Carb Modified    Brief/Interim Summary: HPI: Sara Juarez is a 56 y.o. woman with a history of HTN and uncontrolled Type 2 DM, GERD, obesity, anxiety, and glaucoma who was referred to the ED by her PCP for evaluation for sepsis in the setting of LUTS.  The patient has a history of complicated E. Coli UTI/Acute pyelonephritis and required admission to the hospital in March.  For the past three days, she has experienced recurrent fever (to 101 at home) with urinary frequency.  She denies dysuria.  She has  had chills and sweats.  She has had nausea but no vomiting.  She has had pelvic pain, but no CVA tenderness.  No syncope.  She has chronic neck and back pain, chronic muscle spasms, and intermittent chest pain that she relates to muscular origin.  She has intermittent shortness of breath that she treats with an albuterol inhaler prn.  ED Course: T 100.4.  HR 110.  LA 3.08.  Sodium 126.  Glucose 564.  WBC count 10.2.  Hgb 15.6.  Platelet count 333. U/A is positive for glucose, protein, ketones, large leukocytes, TNTC WBC, few bacteria, and mucous.  Negative for nitrites.  She has received 2L of NS, IV morphine for pain, IV insulin 10units, Rocephin, and acetaminophen for fever.  Hospitalist asked to admit.  Hospital course: HPI: Sara A Southerlandis a 56 y.o.woman with a history of HTN and uncontrolled Type 2 DM, GERD, obesity, anxiety, and glaucoma who was referred to the ED by her PCP for evaluation for sepsis in the setting of LUTS. The patient has a history of complicated E. Coli UTI/Acute pyelonephritis and required admission to the hospital in March. For the past three days, she has experienced recurrent fever (to 101 at home) with urinary frequency.  Pt admitted with E coli bacteremia and SIRS  Assessment & Plan:   1. Sepsis secondary to E coli Bacteremia and UTI -  Treated with ceftriaxone 2 gm IV daily, E coli was pansensitive but  more sensitive to ciprofloxacin and will send home with 12 more days of ciprofloxacin for full 14 day treatment, working on hyperglycemia will send home on insulin, will need 14 full days of antibiotics to treat this infection.  Follow up with final blood cultures with PCP.  Also Rx for fluconazole if needed for yeast infection. 2. Uncontrolled type 2 diabetes mellitus - poorly controlled as evidenced by A1c >11%.  Pt is willing to go on insulin.  Started education in hospital and referred for outpatient education services.  likely will do best with 70/30  insulin 2 injections per day at discharge and to follow up with her diabetes provider at [facility].  Discharge on novolog flexpen 70/30 - with instructions.  Placed order for endocrinology referral also asked patient to ask for PCP for assistance.  In addition, changed metformin to Glucophage XR 500 mg tabs, take 2 tabs with breakfast and 2 tabs with supper, reviewed hypoglycemia with patient and asked her to test blood glucose 4-5 times per day and as needed.   Asked patient to please stop smoking. Nursing worked with her for diabetes education, also ordered the diabetes videos and ordered for education materials to be given to patient prior to discharge.  Pt says she felt comfortable with giving the insulin shots at home.  Recommend that her PCP prescribe a glucagon emergency kit for patient on follow up to keep at home.   3. Tobacco abuse - Pt left the floor to go and smoke yesterday after being asked not to do so also refused nicotine patch. She is at very high risk for further diabetes complications if she continues this and this was explained to her and she verbalized understanding.  4. Hypertension - blood pressures controlled, continue current therapy.  5. GERD - continue pantoprazole for GI protection.  6. GAD - continue home medications, stable.  7. Chronic pain - continue home medications, stable 8. Glaucoma - stable, continue home medications.  9. Constipation - senokot ordered BID  DVT prophylaxis: enoxaparin Code Status: full  Family Communication: husband at bedside Disposition Plan: home  Discharge Diagnoses:  Principal Problem:   Sepsis secondary to UTI ([place]) Active Problems:   HTN (hypertension)   DM (diabetes mellitus), type 2, uncontrolled ([place])   Chronic pain   Constipation   E coli bacteremia  Discharge Instructions  Discharge Instructions    Ambulatory referral to Endocrinology    Complete by:  As directed    Ambulatory referral to Nutrition and  Diabetic Education    Complete by:  As directed    Discharge instructions    Complete by:  As directed    Test blood glucose 4 times per day.  Take insulin Pen shot as directed. Do not take insulin if you do not eat. If you take your morning insulin shot you must eat lunch.  Check your blood sugar if you feel like your blood sugar is low.  Stop smoking or using any nicotine products.   See your primary care provider in 2-3 days for recheck.   Seek medical care or Return if symptoms recur or new problem develops.   Increase activity slowly    Complete by:  As directed    Referral to Nutrition and Diabetes Services    Complete by:  As directed    Choose type of Diabetes Self-Management Training (DSMT) training services and number of hours requested:  Does not apply   Check all special needs that apply to patient  requiring 1 on 1 DSMT:  Does not apply   DSMT Content:  Basic nutrition management   Choose the type of Medical Nutrition Therapy (MNT) and number of hours:  Does not apply   FOR MEDICARE PATIENTS: I hereby certify that I am managing this beneficiary's diabetes condition and that the above prescribed training is a necessary part of management.:  Does not apply     Allergies as of 04/17/2017      Reactions   Erythromycin Other (See Comments)   Other reaction(s): CRAMPS   Aspirin Itching, Rash   Other reaction(s): RASH      Medication List    STOP taking these medications   amoxicillin-clavulanate 875-125 MG tablet Commonly known as:  AUGMENTIN   metFORMIN 500 MG tablet Commonly known as:  GLUCOPHAGE Replaced by:  metFORMIN 500 MG 24 hr tablet     TAKE these medications   ALPRAZolam 0.5 MG tablet Commonly known as:  XANAX Take 0.25-0.5 mg by mouth 3 (three) times daily as needed for anxiety.   cetirizine 10 MG tablet Commonly known as:  ZYRTEC Take 10 mg by mouth daily.   ciprofloxacin 500 MG tablet Commonly known as:  CIPRO Take 1 tablet (500 mg total) by mouth 2  (two) times daily.   cyclobenzaprine 10 MG tablet Commonly known as:  FLEXERIL Take 1 tablet (10 mg total) by mouth 2 (two) times daily as needed for muscle spasms.   fluconazole 150 MG tablet Commonly known as:  DIFLUCAN Take 1 tablet (150 mg total) by mouth once as needed (yeast infection after antibiotics). Take after you complete antibiotics. What changed:  when to take this  reasons to take this   fluticasone 50 MCG/ACT nasal spray Commonly known as:  FLONASE Place 1 spray into both nostrils daily as needed for allergies.   HYDROcodone-acetaminophen 10-325 MG tablet Commonly known as:  NORCO Take 1 tablet by mouth every 6 (six) hours as needed for pain.   insulin aspart protamine - aspart (70-30) 100 UNIT/ML FlexPen Commonly known as:  NOVOLOG 70/30 MIX Take 35 units with breakfast and take 30 units with supper everyday.   Insulin Pen Needle 31G X 5 MM Misc 1 Units by Does not apply route as directed.   lisinopril 20 MG tablet Commonly known as:  PRINIVIL,ZESTRIL Take 20 mg by mouth daily.   LUMIGAN 0.01 % Soln Generic drug:  bimatoprost Place 1 drop into both eyes every evening.   metFORMIN 500 MG 24 hr tablet Commonly known as:  GLUCOPHAGE XR Take 2 tabs with breakfast and 2 tabs with supper everyday Replaces:  metFORMIN 500 MG tablet   omeprazole 20 MG capsule Commonly known as:  PRILOSEC Take 20 mg by mouth 2 (two) times daily before a meal.   PAZEO 0.7 % Soln Generic drug:  Olopatadine HCl Place 1 drop into both eyes daily.   PROAIR HFA 108 (90 Base) MCG/ACT inhaler Generic drug:  albuterol Inhale 1 puff into the lungs every 6 (six) hours as needed for wheezing.   triamcinolone cream 0.1 % Commonly known as:  KENALOG Apply 1 application topically daily.   Vitamin D (Ergocalciferol) 50000 units Caps capsule Commonly known as:  DRISDOL Take 50,000 Units by mouth every Tuesday.   zafirlukast 20 MG tablet Commonly known as:  ACCOLATE Take 20 mg  by mouth every evening.      Follow-up Information    Marry Guan. Schedule an appointment as soon as possible for a visit in 3  day(s).   Specialty:  Family Medicine Why:  Hospital Follow Up  Contact information: [address] [place] [address] [phone]          Allergies  Allergen Reactions  . Erythromycin Other (See Comments)    Other reaction(s): CRAMPS  . Aspirin Itching and Rash    Other reaction(s): RASH  Procedures/Studies: Dg Chest Port 1 View  Result Date: 04/15/2017 CLINICAL DATA:  Sepsis. EXAM: PORTABLE CHEST 1 VIEW COMPARISON:  February 21, 2017 FINDINGS: The heart size and mediastinal contours are within normal limits. Both lungs are clear. The visualized skeletal structures are unremarkable. IMPRESSION: No active disease. Electronically Signed   By: Dorise Bullion III M.D   On: 04/15/2017 21:03   (Echo, Carotid, EGD, Colonoscopy, ERCP)    Subjective: Pt says she is leaving today, not willing to stay, left the floor yesterday to go and smoke   Discharge Exam: Vitals:   04/16/17 2227 04/17/17 0528  BP: 130/81 (!) 142/81  Pulse: (!) 104 (!) 101  Resp: 18 18  Temp: 98.7 F (37.1 C) 98.6 F (37 C)   Vitals:   04/16/17 0841 04/16/17 1500 04/16/17 2227 04/17/17 0528  BP: (!) 128/94 134/76 130/81 (!) 142/81  Pulse:  (!) 107 (!) 104 (!) 101  Resp:  _0 Temp:  99.3 F (37.4 C) 98.7 F (37.1 C) 98.6 F (37 C)  TempSrc:  Oral Oral Oral  SpO2:  96% 99% (!) 89%  Weight:      Height:       General exam: awake, alert, NAD. Cooperative. Nontoxic appearing.  Respiratory system: Clear. No increased work of breathing. Cardiovascular system: S1 & S2 heard, tachycardic. No JVD. Gastrointestinal system: Abdomen is nondistended, soft and nontender. Normal bowel sounds heard. Central nervous system: Alert and oriented. No focal neurological deficits. Extremities: no Cyanosis.  The results of significant diagnostics from this hospitalization  (including imaging, microbiology, ancillary and laboratory) are listed below for reference.     Microbiology: Recent Results (from the past 240 hour(s))  Urine culture     Status: Abnormal   Collection Time: 04/15/17  3:55 PM  Result Value Ref Range Status   Specimen Description URINE, CLEAN CATCH  Final   Special Requests NONE  Final   Culture >=100,000 COLONIES/mL ESCHERICHIA COLI (A)  Final   Report Status 04/17/2017 FINAL  Final   Organism ID, Bacteria ESCHERICHIA COLI (A)  Final      Susceptibility   Escherichia coli - MIC*    AMPICILLIN <=2 SENSITIVE Sensitive     CEFAZOLIN <=4 SENSITIVE Sensitive     CEFTRIAXONE <=1 SENSITIVE Sensitive     CIPROFLOXACIN <=0.25 SENSITIVE Sensitive     GENTAMICIN <=1 SENSITIVE Sensitive     IMIPENEM <=0.25 SENSITIVE Sensitive     NITROFURANTOIN <=16 SENSITIVE Sensitive     TRIMETH/SULFA <=20 SENSITIVE Sensitive     AMPICILLIN/SULBACTAM <=2 SENSITIVE Sensitive     PIP/TAZO <=4 SENSITIVE Sensitive     Extended ESBL NEGATIVE Sensitive     * >=100,000 COLONIES/mL ESCHERICHIA COLI  Blood culture (routine x 2)     Status: Abnormal (Preliminary result)   Collection Time: 04/15/17  4:18 PM  Result Value Ref Range Status   Specimen Description BLOOD LEFT ANTECUBITAL  Final   Special Requests   Final    BOTTLES DRAWN AEROBIC AND ANAEROBIC Blood Culture adequate volume   Culture  Setup Time   Final    GRAM NEGATIVE RODS AEROBIC  BOTTLE ONLY CRITICAL RESULT CALLED TO, READ BACK BY AND VERIFIED WITH: J SO,PHARMD AT 2671 04/16/17 BY L BENFIELD    Culture ESCHERICHIA COLI SUSCEPTIBILITIES TO FOLLOW  (A)  Final   Report Status PENDING  Incomplete  Blood Culture ID Panel (Reflexed)     Status: Abnormal   Collection Time: 04/15/17  4:18 PM  Result Value Ref Range Status   Enterococcus species NOT DETECTED NOT DETECTED Final   Listeria monocytogenes NOT DETECTED NOT DETECTED Final   Staphylococcus species NOT DETECTED NOT DETECTED Final    Staphylococcus aureus NOT DETECTED NOT DETECTED Final   Streptococcus species NOT DETECTED NOT DETECTED Final   Streptococcus agalactiae NOT DETECTED NOT DETECTED Final   Streptococcus pneumoniae NOT DETECTED NOT DETECTED Final   Streptococcus pyogenes NOT DETECTED NOT DETECTED Final   Acinetobacter baumannii NOT DETECTED NOT DETECTED Final   Enterobacteriaceae species DETECTED (A) NOT DETECTED Final    Comment: Enterobacteriaceae represent a large family of gram-negative bacteria, not a single organism. CRITICAL RESULT CALLED TO, READ BACK BY AND VERIFIED WITH: J SO, PHARMD AT 2458 04/16/17 BY L BENFIELD    Enterobacter cloacae complex NOT DETECTED NOT DETECTED Final   Escherichia coli DETECTED (A) NOT DETECTED Final    Comment: CRITICAL RESULT CALLED TO, READ BACK BY AND VERIFIED WITH: J SO,PHARMD AT 0998 04/16/17 BY L BENFIELD    Klebsiella oxytoca NOT DETECTED NOT DETECTED Final   Klebsiella pneumoniae NOT DETECTED NOT DETECTED Final   Proteus species NOT DETECTED NOT DETECTED Final   Serratia marcescens NOT DETECTED NOT DETECTED Final   Carbapenem resistance NOT DETECTED NOT DETECTED Final   Haemophilus influenzae NOT DETECTED NOT DETECTED Final   Neisseria meningitidis NOT DETECTED NOT DETECTED Final   Pseudomonas aeruginosa NOT DETECTED NOT DETECTED Final   Candida albicans NOT DETECTED NOT DETECTED Final   Candida glabrata NOT DETECTED NOT DETECTED Final   Candida krusei NOT DETECTED NOT DETECTED Final   Candida parapsilosis NOT DETECTED NOT DETECTED Final   Candida tropicalis NOT DETECTED NOT DETECTED Final  Blood culture (routine x 2)     Status: None (Preliminary result)   Collection Time: 04/15/17  4:28 PM  Result Value Ref Range Status   Specimen Description BLOOD LEFT HAND  Final   Special Requests IN PEDIATRIC BOTTLE Blood Culture adequate volume  Final   Culture NO GROWTH < 24 HOURS  Final   Report Status PENDING  Incomplete     Labs: BNP (last 3 results) No  results for input(s): BNP in the last 8760 hours. Basic Metabolic Panel:  Recent Labs Lab 04/15/17 1224 04/16/17 0504 04/17/17 0533  NA 126* 132* 134*  K 4.3 4.3 3.6  CL 88* 100* 102  CO2 26 20* 23  GLUCOSE 564* 362* 214*  BUN _0 CREATININE 0.82 0.75 0.52  CALCIUM 9.5 8.4* 8.1*   Liver Function Tests: No results for input(s): AST, ALT, ALKPHOS, BILITOT, PROT, ALBUMIN in the last 168 hours. No results for input(s): LIPASE, AMYLASE in the last 168 hours. No results for input(s): AMMONIA in the last 168 hours. CBC:  Recent Labs Lab 04/15/17 1224 04/16/17 0504 04/17/17 0533  WBC 10.2 10.6* 6.2  NEUTROABS  --   --  3.0  HGB 15.6* 13.2 12.8  HCT 45.7 40.2 38.8  MCV 82.6 83.6 82.7  PLT 333 286 275   Cardiac Enzymes: No results for input(s): CKTOTAL, CKMB, CKMBINDEX, TROPONINI in the last 168 hours. BNP: Invalid input(s): POCBNP CBG:  Recent Labs Lab 04/16/17 1232 04/16/17 1704 04/16/17 2232 04/17/17 0408 04/17/17 0750  GLUCAP 306* 266* 219* 200* 243*   D-Dimer No results for input(s): DDIMER in the last 72 hours. Hgb A1c No results for input(s): HGBA1C in the last 72 hours. Lipid Profile No results for input(s): CHOL, HDL, LDLCALC, TRIG, CHOLHDL, LDLDIRECT in the last 72 hours. Thyroid function studies No results for input(s): TSH, T4TOTAL, T3FREE, THYROIDAB in the last 72 hours.  Invalid input(s): FREET3 Anemia work up No results for input(s): VITAMINB12, FOLATE, FERRITIN, TIBC, IRON, RETICCTPCT in the last 72 hours. Urinalysis    Component Value Date/Time   COLORURINE STRAW (A) 04/15/2017 1555   APPEARANCEUR CLOUDY (A) 04/15/2017 1555   LABSPEC 1.025 04/15/2017 1555   PHURINE 5.0 04/15/2017 1555   GLUCOSEU >=500 (A) 04/15/2017 1555   HGBUR SMALL (A) 04/15/2017 1555   BILIRUBINUR NEGATIVE 04/15/2017 1555   KETONESUR 5 (A) 04/15/2017 1555   PROTEINUR 100 (A) 04/15/2017 1555   NITRITE NEGATIVE 04/15/2017 1555   LEUKOCYTESUR LARGE (A) 04/15/2017  1555   Sepsis Labs Invalid input(s): PROCALCITONIN,  WBC,  LACTICIDVEN Microbiology Recent Results (from the past 240 hour(s))  Urine culture     Status: Abnormal   Collection Time: 04/15/17  3:55 PM  Result Value Ref Range Status   Specimen Description URINE, CLEAN CATCH  Final   Special Requests NONE  Final   Culture >=100,000 COLONIES/mL ESCHERICHIA COLI (A)  Final   Report Status 04/17/2017 FINAL  Final   Organism ID, Bacteria ESCHERICHIA COLI (A)  Final      Susceptibility   Escherichia coli - MIC*    AMPICILLIN <=2 SENSITIVE Sensitive     CEFAZOLIN <=4 SENSITIVE Sensitive     CEFTRIAXONE <=1 SENSITIVE Sensitive     CIPROFLOXACIN <=0.25 SENSITIVE Sensitive     GENTAMICIN <=1 SENSITIVE Sensitive     IMIPENEM <=0.25 SENSITIVE Sensitive     NITROFURANTOIN <=16 SENSITIVE Sensitive     TRIMETH/SULFA <=20 SENSITIVE Sensitive     AMPICILLIN/SULBACTAM <=2 SENSITIVE Sensitive     PIP/TAZO <=4 SENSITIVE Sensitive     Extended ESBL NEGATIVE Sensitive     * >=100,000 COLONIES/mL ESCHERICHIA COLI  Blood culture (routine x 2)     Status: Abnormal (Preliminary result)   Collection Time: 04/15/17  4:18 PM  Result Value Ref Range Status   Specimen Description BLOOD LEFT ANTECUBITAL  Final   Special Requests   Final    BOTTLES DRAWN AEROBIC AND ANAEROBIC Blood Culture adequate volume   Culture  Setup Time   Final    GRAM NEGATIVE RODS AEROBIC BOTTLE ONLY CRITICAL RESULT CALLED TO, READ BACK BY AND VERIFIED WITH: J SO,PHARMD AT 9450 04/16/17 BY L BENFIELD    Culture ESCHERICHIA COLI SUSCEPTIBILITIES TO FOLLOW  (A)  Final   Report Status PENDING  Incomplete  Blood Culture ID Panel (Reflexed)     Status: Abnormal   Collection Time: 04/15/17  4:18 PM  Result Value Ref Range Status   Enterococcus species NOT DETECTED NOT DETECTED Final   Listeria monocytogenes NOT DETECTED NOT DETECTED Final   Staphylococcus species NOT DETECTED NOT DETECTED Final   Staphylococcus aureus NOT DETECTED  NOT DETECTED Final   Streptococcus species NOT DETECTED NOT DETECTED Final   Streptococcus agalactiae NOT DETECTED NOT DETECTED Final   Streptococcus pneumoniae NOT DETECTED NOT DETECTED Final   Streptococcus pyogenes NOT DETECTED NOT DETECTED Final   Acinetobacter baumannii NOT DETECTED NOT DETECTED Final   Enterobacteriaceae species DETECTED (  A) NOT DETECTED Final    Comment: Enterobacteriaceae represent a large family of gram-negative bacteria, not a single organism. CRITICAL RESULT CALLED TO, READ BACK BY AND VERIFIED WITH: J SO, PHARMD AT 3435 04/16/17 BY L BENFIELD    Enterobacter cloacae complex NOT DETECTED NOT DETECTED Final   Escherichia coli DETECTED (A) NOT DETECTED Final    Comment: CRITICAL RESULT CALLED TO, READ BACK BY AND VERIFIED WITH: J SO,PHARMD AT 6861 04/16/17 BY L BENFIELD    Klebsiella oxytoca NOT DETECTED NOT DETECTED Final   Klebsiella pneumoniae NOT DETECTED NOT DETECTED Final   Proteus species NOT DETECTED NOT DETECTED Final   Serratia marcescens NOT DETECTED NOT DETECTED Final   Carbapenem resistance NOT DETECTED NOT DETECTED Final   Haemophilus influenzae NOT DETECTED NOT DETECTED Final   Neisseria meningitidis NOT DETECTED NOT DETECTED Final   Pseudomonas aeruginosa NOT DETECTED NOT DETECTED Final   Candida albicans NOT DETECTED NOT DETECTED Final   Candida glabrata NOT DETECTED NOT DETECTED Final   Candida krusei NOT DETECTED NOT DETECTED Final   Candida parapsilosis NOT DETECTED NOT DETECTED Final   Candida tropicalis NOT DETECTED NOT DETECTED Final  Blood culture (routine x 2)     Status: None (Preliminary result)   Collection Time: 04/15/17  4:28 PM  Result Value Ref Range Status   Specimen Description BLOOD LEFT HAND  Final   Special Requests IN PEDIATRIC BOTTLE Blood Culture adequate volume  Final   Culture NO GROWTH < 24 HOURS  Final   Report Status PENDING  Incomplete     Time coordinating discharge: 35 minutes  SIGNED:  Irwin Brakeman, MD  [facility] 04/17/2017, 11:26 AM Pager [phone]   If 7PM-7AM, please contact night-coverage [email or web address] Password [email or web address]

## 2017-04-18 LAB — CULTURE, BLOOD (ROUTINE X 2): Special Requests: ADEQUATE

## 2017-04-20 LAB — CULTURE, BLOOD (ROUTINE X 2)
Culture: NO GROWTH
Special Requests: ADEQUATE

## 2017-05-18 ENCOUNTER — Encounter: Payer: Medicare Other | Attending: Family Medicine | Admitting: Registered"

## 2017-05-18 DIAGNOSIS — Z713 Dietary counseling and surveillance: Secondary | ICD-10-CM | POA: Diagnosis not present

## 2017-05-18 DIAGNOSIS — E1165 Type 2 diabetes mellitus with hyperglycemia: Secondary | ICD-10-CM | POA: Diagnosis present

## 2017-05-18 DIAGNOSIS — E118 Type 2 diabetes mellitus with unspecified complications: Secondary | ICD-10-CM

## 2017-05-18 NOTE — Patient Instructions (Signed)
Plan:  Aim for 2-3 Carb Choices per meal (30-45 grams)   Aim for 0-1 Carbs per snack if hungry  Include protein with your meals and snacks Consider reading food labels for Total Carbohydrate of foods Consider getting daily activity as tolerated Consider checking BG at alternate times per day as directed by MD, Consider checking a few times 2 hrs after a meal to see how food is affecting blood sugar.

## 2017-05-18 NOTE — Progress Notes (Signed)
Diabetes Self-Management Education  Visit Type: First/Initial  Appt. Start Time: 1030 Appt. End Time: 1155  05/18/2017  Sara Juarez, identified by name and date of birth, is a 56 y.o. female with a diagnosis of Diabetes: Type 2.   ASSESSMENT Pt states she was told she needed to lose weight to help manage her blood sugar and that motivated her to eat more salad, but got E-coli from lettuce last month and it raised her BG over 600. Pt states it took awhile to recover and she is still having her blood checked to make sure the e-coli is gone.  After this hospitalization for e-coli, pt states she was started on Humalog 75/25 with instructions if her BG was 180 she should take 30 units am & 35 units pm. Pt states she feels like this is too much insulin and made her feel bad. Now patient states she takes 10 units if BG is >150, some days not needing insulin. Patient states she reported this change in insulin dosage to her doctor, Bernerd LimboJohn Mitchell, MD, during her visit last week. Pt reports next visit with doctor is in 2 months.     Diabetes Self-Management Education - 05/18/17 1042      Visit Information   Visit Type First/Initial     Initial Visit   Diabetes Type Type 2   Are you currently following a meal plan? Yes   What type of meal plan do you follow? cut back meat, no pork, salty chips, desert, has started eating a lot of fruit, and kale, avocado   Are you taking your medications as prescribed? No     Health Coping   How would you rate your overall health? Fair     Psychosocial Assessment   Patient Belief/Attitude about Diabetes Motivated to manage diabetes   How often do you need to have someone help you when you read instructions, pamphlets, or other written materials from your doctor or pharmacy? 1 - Never   What is the last grade level you completed in school? 2 yrs college     Complications   Last HgB A1C per patient/outside source 11.5 %  per patient   How often do  you check your blood sugar? > 4 times/day  mostly fasting to decide if needs insulin   Fasting Blood glucose range (mg/dL) [phone]  [phone] usually 180 (this morning)   Number of hypoglycemic episodes per month 0   Number of hyperglycemic episodes per week 0  only once, not weekly, 209 before bed & took 10 units insulin   Have you had a dilated eye exam in the past 12 months? Yes   Have you had a dental exam in the past 12 months? Yes   Are you checking your feet? Yes   How many days per week are you checking your feet? 7     Dietary Intake   Breakfast bagels and PB & almond milk or strawberries or grapefruit OR oatmeal, and PB on half bagel   Snack (morning) cheese crackers OR sesame seeds    Lunch [place] sandwich, tomato OR tuna sandwich    Snack (afternoon) fruit   Dinner cabbage, okra, alfrado noodles with chicken wings, cornbread   Snack (evening) none   Beverage(s) water     Exercise   Exercise Type Light (walking / raking leaves)   How many days per week to you exercise? 3   How many minutes per day do you exercise? 30   Total minutes  per week of exercise 90     Patient Education   Previous Diabetes Education Yes (please comment)  2012   Disease state  Definition of diabetes, type 1 and 2, and the diagnosis of diabetes   Nutrition management  Role of diet in the treatment of diabetes and the relationship between the three main macronutrients and blood glucose level;Food label reading, portion sizes and measuring food.;Carbohydrate counting;Reviewed blood glucose goals for pre and post meals and how to evaluate the patients' food intake on their blood glucose level.   Physical activity and exercise  Role of exercise on diabetes management, blood pressure control and cardiac health.;Identified with patient nutritional and/or medication changes necessary with exercise.   Monitoring Purpose and frequency of SMBG.;Interpreting lab values - A1C, lipid, urine microalbumina.;Identified  appropriate SMBG and/or A1C goals.   Acute complications Taught treatment of hypoglycemia - the 15 rule.   Chronic complications Relationship between chronic complications and blood glucose control   Psychosocial adjustment Role of stress on diabetes     Individualized Goals (developed by patient)   Nutrition Follow meal plan discussed   Physical Activity Exercise 5-7 days per week     Outcomes   Expected Outcomes Demonstrated interest in learning. Expect positive outcomes   Future DMSE PRN   Program Status Completed      Individualized Plan for Diabetes Self-Management Training:   Learning Objective:  Patient will have a greater understanding of diabetes self-management. Patient education plan is to attend individual and/or group sessions per assessed needs and concerns.   Patient Instructions  Plan:  Aim for 2-3 Carb Choices per meal (30-45 grams)   Aim for 0-1 Carbs per snack if hungry  Include protein with your meals and snacks Consider reading food labels for Total Carbohydrate of foods Consider getting daily activity as tolerated Consider checking BG at alternate times per day as directed by MD, Consider checking a few times 2 hrs after a meal to see how food is affecting blood sugar.   Expected Outcomes:  Demonstrated interest in learning. Expect positive outcomes  Education material provided: Living Well with Diabetes, Meal plan card, My Plate, Snack sheet and Carbohydrate counting sheet  If problems or questions, patient to contact team via:  Phone and MyChart  Future DSME appointment: PRN

## 2018-01-24 ENCOUNTER — Emergency Department (HOSPITAL_COMMUNITY)
Admission: EM | Admit: 2018-01-24 | Discharge: 2018-01-24 | Discharge status: Absent without leave | Payer: Medicare Other

## 2018-05-20 ENCOUNTER — Emergency Department (HOSPITAL_BASED_OUTPATIENT_CLINIC_OR_DEPARTMENT_OTHER)
Admission: EM | Admit: 2018-05-20 | Discharge: 2018-05-21 | Disposition: A | Discharge status: Home / Self Care | Payer: Medicare Other | Attending: Emergency Medicine | Admitting: Emergency Medicine

## 2018-05-20 ENCOUNTER — Emergency Department (HOSPITAL_BASED_OUTPATIENT_CLINIC_OR_DEPARTMENT_OTHER): Payer: Medicare Other

## 2018-05-20 ENCOUNTER — Other Ambulatory Visit: Payer: Self-pay

## 2018-05-20 ENCOUNTER — Encounter (HOSPITAL_BASED_OUTPATIENT_CLINIC_OR_DEPARTMENT_OTHER): Payer: Self-pay | Admitting: Emergency Medicine

## 2018-05-20 DIAGNOSIS — E119 Type 2 diabetes mellitus without complications: Secondary | ICD-10-CM | POA: Insufficient documentation

## 2018-05-20 DIAGNOSIS — J181 Lobar pneumonia, unspecified organism: Secondary | ICD-10-CM | POA: Diagnosis not present

## 2018-05-20 DIAGNOSIS — Z794 Long term (current) use of insulin: Secondary | ICD-10-CM | POA: Diagnosis not present

## 2018-05-20 DIAGNOSIS — F17200 Nicotine dependence, unspecified, uncomplicated: Secondary | ICD-10-CM | POA: Insufficient documentation

## 2018-05-20 DIAGNOSIS — Z79899 Other long term (current) drug therapy: Secondary | ICD-10-CM | POA: Insufficient documentation

## 2018-05-20 DIAGNOSIS — J45901 Unspecified asthma with (acute) exacerbation: Secondary | ICD-10-CM

## 2018-05-20 DIAGNOSIS — J189 Pneumonia, unspecified organism: Secondary | ICD-10-CM

## 2018-05-20 DIAGNOSIS — R05 Cough: Secondary | ICD-10-CM | POA: Diagnosis present

## 2018-05-20 DIAGNOSIS — I10 Essential (primary) hypertension: Secondary | ICD-10-CM | POA: Insufficient documentation

## 2018-05-20 HISTORY — DX: Unspecified asthma, uncomplicated: J45.909

## 2018-05-20 LAB — CBC WITH DIFFERENTIAL/PLATELET
BASOS ABS: 0 10*3/uL (ref 0.0–0.1)
BASOS PCT: 0 %
Eosinophils Absolute: 0.2 10*3/uL (ref 0.0–0.7)
Eosinophils Relative: 3 %
HCT: 42.1 % (ref 36.0–46.0)
Hemoglobin: 13.3 g/dL (ref 12.0–15.0)
Lymphocytes Relative: 42 %
Lymphs Abs: 3.2 10*3/uL (ref 0.7–4.0)
MCH: 26.9 pg (ref 26.0–34.0)
MCHC: 31.6 g/dL (ref 30.0–36.0)
MCV: 85.2 fL (ref 78.0–100.0)
Monocytes Absolute: 0.6 10*3/uL (ref 0.1–1.0)
Monocytes Relative: 8 %
NEUTROS PCT: 47 %
Neutro Abs: 3.5 10*3/uL (ref 1.7–7.7)
PLATELETS: 357 10*3/uL (ref 150–400)
RBC: 4.94 MIL/uL (ref 3.87–5.11)
RDW: 15.2 % (ref 11.5–15.5)
WBC: 7.6 10*3/uL (ref 4.0–10.5)

## 2018-05-20 LAB — BASIC METABOLIC PANEL
ANION GAP: 12 (ref 5–15)
BUN: 16 mg/dL (ref 6–20)
CALCIUM: 8.8 mg/dL — AB (ref 8.9–10.3)
CO2: 27 mmol/L (ref 22–32)
Chloride: 102 mmol/L (ref 101–111)
Creatinine, Ser: 0.61 mg/dL (ref 0.44–1.00)
Glucose, Bld: 201 mg/dL — ABNORMAL HIGH (ref 65–99)
Potassium: 3.6 mmol/L (ref 3.5–5.1)
SODIUM: 141 mmol/L (ref 135–145)

## 2018-05-20 MED ORDER — SODIUM CHLORIDE 0.9 % IV BOLUS
500.0000 mL | Freq: Once | INTRAVENOUS | Status: AC
  Administered 2018-05-20: 500 mL via INTRAVENOUS

## 2018-05-20 MED ORDER — METHYLPREDNISOLONE SODIUM SUCC 125 MG IJ SOLR
125.0000 mg | Freq: Once | INTRAMUSCULAR | Status: AC
  Administered 2018-05-20: 125 mg via INTRAVENOUS
  Filled 2018-05-20: qty 2

## 2018-05-20 MED ORDER — ALBUTEROL SULFATE (2.5 MG/3ML) 0.083% IN NEBU
5.0000 mg | INHALATION_SOLUTION | Freq: Once | RESPIRATORY_TRACT | Status: AC
  Administered 2018-05-20: 5 mg via RESPIRATORY_TRACT
  Filled 2018-05-20: qty 6

## 2018-05-20 MED ORDER — DOXYCYCLINE HYCLATE 100 MG PO TABS
100.0000 mg | ORAL_TABLET | Freq: Two times a day (BID) | ORAL | Status: DC
  Administered 2018-05-20: 100 mg via ORAL
  Filled 2018-05-20: qty 1

## 2018-05-20 MED ORDER — SODIUM CHLORIDE 0.9 % IV SOLN
1.0000 g | INTRAVENOUS | Status: DC
  Administered 2018-05-20: 1 g via INTRAVENOUS
  Filled 2018-05-20: qty 10

## 2018-05-20 MED ORDER — IPRATROPIUM-ALBUTEROL 0.5-2.5 (3) MG/3ML IN SOLN
3.0000 mL | Freq: Once | RESPIRATORY_TRACT | Status: AC
  Administered 2018-05-20: 3 mL via RESPIRATORY_TRACT
  Filled 2018-05-20: qty 3

## 2018-05-20 NOTE — Progress Notes (Signed)
Placed patient on 3 liter nasal cannula in triage due to patients SPO2 being 84%.  Patient's SPO2 increased to 95%    RN administered first albuterol treatment.

## 2018-05-20 NOTE — ED Notes (Signed)
Alert, NAD, calm, interactive, resps e/u, speaking in clear complete sentences, no dyspnea noted, skin W&D, VSS, remains on 3L [place], "feel better after neb tx", here for productive cough, sob, facial pressure, imbalance/ equilibrium off, ears full, mild sore throat from coughing, (denies: NVD, fever, bleeding, or visual changes). [facility]P into room[facility]

## 2018-05-20 NOTE — ED Triage Notes (Signed)
Patient states that she has had SOB for the last 3 days - patient also has a course cough

## 2018-05-20 NOTE — ED Notes (Signed)
Ambulating on RA with RT at this time, steady gait.

## 2018-05-20 NOTE — ED Notes (Signed)
No changes. Resting comfortably on O2 3L [place], alert, NAD, calm, interactive, resps e/u, speaking in clear complete sentences, no dyspnea noted, skin W&D, VSS. [facility]

## 2018-05-20 NOTE — Progress Notes (Signed)
Patient ambulated around the department while on pulse ox.  Patient's SPO2 dropped to 83%.  Returned to patient's room and placed her on a 2 liter nasal cannula.

## 2018-05-20 NOTE — ED Provider Notes (Signed)
[facility] EMERGENCY DEPARTMENT Provider Note   CSN: [id number] Arrival date & time: 05/20/18  1947     History   Chief Complaint Chief Complaint  Patient presents with  . Cough    HPI Sara Juarez is a 57 y.o. female.  Patient is a 57 year old female with a history of diabetes, hypertension and asthma who presents with cough and cold symptoms.  She has a 2 to 3-day history of runny nose congestion and nasal drainage with sinus pressure and a cough.  She states she is coughing up some yellow-green sputum.  She feels hot but has not checked her temperature.  She denies any nausea vomiting or diarrhea.  She does use an albuterol inhaler at home and she has been using this without improvement in symptoms.  She is also been taking some over-the-counter sinus medicine without improvement in symptoms.  She denies any leg swelling.  No calf pain.  She does report shortness of breath and increased wheezing.  No chest pain.     Past Medical History:  Diagnosis Date  . Anxiety   . Asthma   . Cervical herniated disc   . Diabetes mellitus without complication (HCC)   . Glaucoma (increased eye pressure)   . HTN (hypertension) 02/21/2017    Patient Active Problem List   Diagnosis Date Noted  . E coli bacteremia 04/16/2017  . Sepsis secondary to UTI (HCC) 04/15/2017  . DM (diabetes mellitus), type 2, uncontrolled (HCC) 04/15/2017  . Chronic pain 04/15/2017  . Constipation 04/15/2017  . Pyelonephritis 02/21/2017  . Vaginal discharge 02/21/2017  . Sepsis (HCC) 02/21/2017  . DM2 (diabetes mellitus, type 2) (HCC) 02/21/2017  . HTN (hypertension) 02/21/2017    Past Surgical History:  Procedure Laterality Date  . BILATERAL CARPAL TUNNEL RELEASE    . BLADDER SUSPENSION    . NASAL SINUS SURGERY    . PARTIAL HYSTERECTOMY       OB History   None      Home Medications    Prior to Admission medications   Medication Sig Start Date End Date Taking? Authorizing  Provider  ALPRAZolam [name](XANAX) 0.5 MG tablet Take 0.25-0.5 mg by mouth 3 (three) times daily as needed for anxiety.  08/07/16   [provider]  amoxicillin-clavulanate (AUGMENTIN) 875-125 MG tablet Take 1 tablet by mouth 2 (two) times daily. One po bid x 7 days 05/21/18   Rolan BuccoBelfi, Otis Burress, MD  cetirizine (ZYRTEC) 10 MG tablet Take 10 mg by mouth daily.    [provider]  cyclobenzaprine (FLEXERIL) 10 MG tablet Take 1 tablet (10 mg total) by mouth 2 (two) times daily as needed for muscle spasms. 09/07/16   Dowless, Lelon MastSamantha Tripp, PA-C  doxycycline (VIBRAMYCIN) 100 MG capsule Take 1 capsule (100 mg total) by mouth 2 (two) times daily. One po bid x 7 days 05/21/18   Rolan BuccoBelfi, Dewitt Judice, MD  fluconazole (DIFLUCAN) 150 MG tablet Take 1 tablet (150 mg total) by mouth once as needed (yeast infection after antibiotics). Take after you complete antibiotics. Patient not taking: Reported on 05/18/2017 04/17/17   Cleora FleetJohnson, Clanford L, MD  fluticasone (FLONASE) 50 MCG/ACT nasal spray Place 1 spray into both nostrils daily as needed for allergies. 06/16/16   [provider]  HYDROcodone-acetaminophen (NORCO) 10-325 MG tablet Take 1 tablet by mouth every 6 (six) hours as needed for pain. 07/08/16   [provider]  insulin aspart protamine - aspart (NOVOLOG 70/30 MIX) (70-30) 100 UNIT/ML FlexPen Take 35 units with  breakfast and take 30 units with supper everyday. Patient not taking: Reported on 05/18/2017 04/17/17   Standley Dakins L, MD  insulin lispro protamine-lispro (HUMALOG 75/25 MIX) (75-25) 100 UNIT/ML SUSP injection Inject 10 Units into the skin.    [provider]  Insulin Pen Needle 31G X 5 MM MISC 1 Units by Does not apply route as directed. 04/17/17   Johnson, Clanford L, MD  lisinopril (PRINIVIL,ZESTRIL) 20 MG tablet Take 20 mg by mouth daily.    [provider]  LUMIGAN 0.01 % SOLN Place 1 drop into both eyes every evening. 06/04/16   [provider]    metFORMIN (GLUCOPHAGE XR) 500 MG 24 hr tablet Take 2 tabs with breakfast and 2 tabs with supper everyday 04/17/17   Cleora Fleet, MD  omeprazole (PRILOSEC) 20 MG capsule Take 20 mg by mouth 2 (two) times daily before a meal.  07/08/16   [provider]  PAZEO 0.7 % SOLN Place 1 drop into both eyes daily. 06/16/16   [provider]  predniSONE (DELTASONE) 20 MG tablet 2 tabs po daily x 4 days 05/21/18   Rolan Bucco, MD  PROAIR HFA 108 [phone] Base) MCG/ACT inhaler Inhale 1 puff into the lungs every 6 (six) hours as needed for wheezing. 06/08/16   [provider]  triamcinolone cream (KENALOG) 0.1 % Apply 1 application topically daily. 01/14/17   [provider]  Vitamin D, Ergocalciferol, (DRISDOL) 50000 units CAPS capsule Take 50,000 Units by mouth every Tuesday. 02/09/17   [provider]  zafirlukast (ACCOLATE) 20 MG tablet Take 20 mg by mouth every evening. 06/16/16   [provider]    Family History Family History  Problem Relation Age of Onset  . Hypertension Mother   . Diabetes Mother   . Hypertension Father   . Diabetes Father   . Colon cancer Father     Social History Social History   Tobacco Use  . Smoking status: Current Every Day Smoker  . Smokeless tobacco: Never Used  . Tobacco comment: Hookah - most days  Substance Use Topics  . Alcohol use: No  . Drug use: No     Allergies   Erythromycin and Aspirin   Review of Systems Review of Systems  Constitutional: Positive for fatigue. Negative for chills, diaphoresis and fever.  HENT: Positive for congestion, postnasal drip, rhinorrhea, sinus pressure and sneezing.   Eyes: Negative.   Respiratory: Positive for cough, shortness of breath and wheezing. Negative for chest tightness.   Cardiovascular: Negative for chest pain and leg swelling.  Gastrointestinal: Negative for abdominal pain, blood in stool, diarrhea, nausea and vomiting.  Genitourinary: Negative for  difficulty urinating, flank pain, frequency and hematuria.  Musculoskeletal: Negative for arthralgias and back pain.  Skin: Negative for rash.  Neurological: Negative for dizziness, speech difficulty, weakness, numbness and headaches.     Physical Exam Updated Vital Signs BP (!) 143/74   Pulse (!) 104   Temp 99.2 F (37.3 C) (Oral)   Resp (!) 23   Ht 5' 3.5" (1.613 m)   Wt 90.7 kg (200 lb)   SpO2 96%   BMI 34.87 kg/m   Physical Exam  Constitutional: She is oriented to person, place, and time. She appears well-developed and well-nourished.  HENT:  Head: Normocephalic and atraumatic.  Right Ear: External ear normal.  Left Ear: External ear normal.  Mouth/Throat: Oropharynx is clear and moist.  Eyes: Pupils are equal, round, and reactive to light.  Neck: Normal  range of motion. Neck supple.  Cardiovascular: Normal rate, regular rhythm and normal heart sounds.  Pulmonary/Chest: Effort normal. No respiratory distress. She has wheezes. She has no rales. She exhibits no tenderness.  Mild tachypnea and increased work of breathing  Abdominal: Soft. Bowel sounds are normal. There is no tenderness. There is no rebound and no guarding.  Musculoskeletal: Normal range of motion. She exhibits no edema.  No edema or calf tenderness  Lymphadenopathy:    She has no cervical adenopathy.  Neurological: She is alert and oriented to person, place, and time.  Skin: Skin is warm and dry. No rash noted.  Psychiatric: She has a normal mood and affect.     ED Treatments / Results  Labs (all labs ordered are listed, but only abnormal results are displayed) Labs Reviewed  BASIC METABOLIC PANEL - Abnormal; Notable for the following components:      Result Value   Glucose, Bld 201 (*)    Calcium 8.8 (*)    All other components within normal limits  CULTURE, BLOOD (ROUTINE X 2)  CULTURE, BLOOD (ROUTINE X 2)  CBC WITH DIFFERENTIAL/PLATELET    EKG EKG Interpretation  Date/Time:  Saturday  May 20 2018 19:59:53 EDT Ventricular Rate:  110 PR Interval:  156 QRS Duration: 86 QT Interval:  340 QTC Calculation: 460 R Axis:   65 Text Interpretation:  Sinus tachycardia Anterior infarct , age undetermined Abnormal ECG since last tracing no significant change Confirmed by Rolan Bucco [id number]) on 05/20/2018 9:18:40 PM   Radiology Dg Chest 2 View  Result Date: 05/20/2018 CLINICAL DATA:  Shortness of breath for several days EXAM: CHEST - 2 VIEW COMPARISON:  01/26/2018 FINDINGS: Cardiac shadow is stable. The lungs are well aerated bilaterally. Mild right middle lobe infiltrate is noted. No bony abnormality is seen. IMPRESSION: Mild right middle lobe infiltrate. Electronically Signed   By: Alcide Clever M.D.   On: 05/20/2018 20:59    Procedures Procedures (including critical care time)  Medications Ordered in ED Medications  cefTRIAXone (ROCEPHIN) 1 g in sodium chloride 0.9 % 100 mL IVPB (0 g Intravenous Stopped 05/20/18 2336)  doxycycline (VIBRA-TABS) tablet 100 mg (100 mg Oral Given 05/20/18 2215)  sodium chloride 0.9 % bolus 500 mL (has no administration in time range)  albuterol (PROVENTIL HFA;VENTOLIN HFA) 108 (90 Base) MCG/ACT inhaler 2 puff (has no administration in time range)  albuterol (PROVENTIL) (2.5 MG/3ML) 0.083% nebulizer solution 5 mg (5 mg Nebulization Given 05/20/18 2000)  ipratropium-albuterol (DUONEB) 0.5-2.5 (3) MG/3ML nebulizer solution 3 mL (3 mLs Nebulization Given 05/20/18 2105)  methylPREDNISolone sodium succinate (SOLU-MEDROL) 125 mg/2 mL injection 125 mg (125 mg Intravenous Given 05/20/18 2220)     Initial Impression / Assessment and Plan / ED Course  I have reviewed the triage vital signs and the nursing notes.  Pertinent labs & imaging results that were available during my care of the patient were reviewed by me and considered in my medical decision making (see chart for details).     Patient is a 57 year old female who presents with cough and wheezing.  Chest  x-ray shows evidence of pneumonia.  She has no recent hospitalizations.  She has no suggestions of sepsis.  Her white count is normal.  She did require multiple nebulizer treatments in the ED.  She still remains hypoxic on ambulation with oxygen saturations going down into the mid 80s with ambulation.  She has maintained on nasal cannula.  She was given Solu-Medrol.  She was given IV  antibiotics including Rocephin and doxycycline.  She has an erythromycin allergy.  Patient is adamantly refusing admission.  I had a long discussion with her and her husband.  Initially she refused and then after discussion she agreed to be admitted.  However now she is refusing again to be admitted.  I have instructed her that she is requiring oxygen on ambulation and any movement.  I did advise her that she has been given multiple nebulizer treatments as well as IV antibiotics and she needs to be admitted for further treatment.  I advised her that she could rapidly decompensate and ultimately could lead to death.  She is adamantly refusing admission and states that she will follow-up with her PCP in the morning.  She was discharged with prescriptions for prednisone as well as Augmentin and doxycycline.  She was dispensed an albuterol inhaler as she says that she was out of the one that she was using earlier today.  Final Clinical Impressions(s) / ED Diagnoses   Final diagnoses:  Community acquired pneumonia of right middle lobe of lung (HCC)  Moderate asthma with exacerbation, unspecified whether persistent    ED Discharge Orders        Ordered    amoxicillin-clavulanate (AUGMENTIN) 875-125 MG tablet  2 times daily     05/21/18 0022    doxycycline (VIBRAMYCIN) 100 MG capsule  2 times daily     05/21/18 0022    predniSONE (DELTASONE) 20 MG tablet     05/21/18 0022       Rolan Bucco, MD 05/21/18 [phone]

## 2018-05-21 DIAGNOSIS — J181 Lobar pneumonia, unspecified organism: Secondary | ICD-10-CM | POA: Diagnosis not present

## 2018-05-21 MED ORDER — ALBUTEROL SULFATE HFA 108 (90 BASE) MCG/ACT IN AERS
2.0000 | INHALATION_SPRAY | RESPIRATORY_TRACT | Status: DC | PRN
  Administered 2018-05-21: 2 via RESPIRATORY_TRACT
  Filled 2018-05-21: qty 6.7

## 2018-05-21 MED ORDER — PREDNISONE 20 MG PO TABS: ORAL_TABLET | ORAL | 0 refills | Status: DC

## 2018-05-21 MED ORDER — AMOXICILLIN-POT CLAVULANATE 875-125 MG PO TABS: 1.0000 | ORAL_TABLET | Freq: Two times a day (BID) | ORAL | 0 refills | Status: DC

## 2018-05-21 MED ORDER — DOXYCYCLINE HYCLATE 100 MG PO CAPS: 100.0000 mg | ORAL_CAPSULE | Freq: Two times a day (BID) | ORAL | 0 refills | Status: DC
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
## 2018-05-21 NOTE — ED Notes (Signed)
Pt indecisive and resistant to admission. AMA discussed. [facility].

## 2018-05-26 LAB — CULTURE, BLOOD (ROUTINE X 2)
Culture: NO GROWTH
Culture: NO GROWTH
SPECIAL REQUESTS: ADEQUATE
Special Requests: ADEQUATE

## 2019-02-21 ENCOUNTER — Emergency Department (HOSPITAL_COMMUNITY)
Admission: EM | Admit: 2019-02-21 | Discharge: 2019-02-21 | Disposition: A | Discharge status: Home / Self Care | Payer: Medicare Other | Attending: Emergency Medicine | Admitting: Emergency Medicine

## 2019-02-21 ENCOUNTER — Encounter (HOSPITAL_COMMUNITY): Payer: Self-pay | Admitting: Emergency Medicine

## 2019-02-21 DIAGNOSIS — R51 Headache: Secondary | ICD-10-CM | POA: Diagnosis present

## 2019-02-21 DIAGNOSIS — E119 Type 2 diabetes mellitus without complications: Secondary | ICD-10-CM | POA: Diagnosis not present

## 2019-02-21 DIAGNOSIS — F17200 Nicotine dependence, unspecified, uncomplicated: Secondary | ICD-10-CM | POA: Insufficient documentation

## 2019-02-21 DIAGNOSIS — H53149 Visual discomfort, unspecified: Secondary | ICD-10-CM | POA: Diagnosis not present

## 2019-02-21 DIAGNOSIS — Z79899 Other long term (current) drug therapy: Secondary | ICD-10-CM | POA: Insufficient documentation

## 2019-02-21 DIAGNOSIS — Z794 Long term (current) use of insulin: Secondary | ICD-10-CM | POA: Insufficient documentation

## 2019-02-21 DIAGNOSIS — R110 Nausea: Secondary | ICD-10-CM | POA: Diagnosis not present

## 2019-02-21 DIAGNOSIS — I10 Essential (primary) hypertension: Secondary | ICD-10-CM | POA: Insufficient documentation

## 2019-02-21 DIAGNOSIS — R519 Headache, unspecified: Secondary | ICD-10-CM

## 2019-02-21 MED ORDER — DIPHENHYDRAMINE HCL 50 MG/ML IJ SOLN
25.0000 mg | Freq: Once | INTRAMUSCULAR | Status: AC
  Administered 2019-02-21: 25 mg via INTRAMUSCULAR
  Filled 2019-02-21: qty 1

## 2019-02-21 MED ORDER — PROCHLORPERAZINE EDISYLATE 10 MG/2ML IJ SOLN
10.0000 mg | Freq: Once | INTRAMUSCULAR | Status: AC
  Administered 2019-02-21: 10 mg via INTRAMUSCULAR
  Filled 2019-02-21: qty 2

## 2019-02-21 MED ORDER — DEXAMETHASONE 4 MG PO TABS
10.0000 mg | ORAL_TABLET | Freq: Once | ORAL | Status: AC
  Administered 2019-02-21: 10 mg via ORAL
  Filled 2019-02-21: qty 3

## 2019-02-21 NOTE — ED Provider Notes (Signed)
[facility] EMERGENCY DEPARTMENT Provider Note   CSN: [id number] Arrival date & time: 02/21/19  0503    History   Chief Complaint Chief Complaint  Patient presents with  . Migraine    HPI Sara Juarez is a 58 y.o. female.     58 yo F with a chief complaint of a headache.  Patient states that she gets these off and on usually is in the frontal region described as a throbbing and sharp.  Worse with bright lights and loud noises.  Nausea but denies vomiting.  Denies trauma denies fevers denies neck pain.  She denies unilateral numbness or weakness denies difficulty with speech or swallowing.  States they have gotten much worse here recently because she has been more depressed with her husband going away for drug rehabilitation.  This feels like her prior headaches.  Slow in onset.  The history is provided by the patient.  Migraine  Associated symptoms include headaches. Pertinent negatives include no chest pain and no shortness of breath.  Illness  Severity:  Moderate Onset quality:  Gradual Duration:  2 days Timing:  Constant Progression:  Worsening Chronicity:  Recurrent Associated symptoms: headaches   Associated symptoms: no chest pain, no congestion, no fever, no myalgias, no nausea, no rhinorrhea, no shortness of breath, no vomiting and no wheezing     Past Medical History:  Diagnosis Date  . Anxiety   . Asthma   . Cervical herniated disc   . Diabetes mellitus without complication (HCC)   . Glaucoma (increased eye pressure)   . HTN (hypertension) 02/21/2017    Patient Active Problem List   Diagnosis Date Noted  . E coli bacteremia 04/16/2017  . Sepsis secondary to UTI (HCC) 04/15/2017  . DM (diabetes mellitus), type 2, uncontrolled (HCC) 04/15/2017  . Chronic pain 04/15/2017  . Constipation 04/15/2017  . Pyelonephritis 02/21/2017  . Vaginal discharge 02/21/2017  . Sepsis (HCC) 02/21/2017  . DM2 (diabetes mellitus, type 2) (HCC)  02/21/2017  . HTN (hypertension) 02/21/2017    Past Surgical History:  Procedure Laterality Date  . BILATERAL CARPAL TUNNEL RELEASE    . BLADDER SUSPENSION    . NASAL SINUS SURGERY    . PARTIAL HYSTERECTOMY       OB History   No obstetric history on file.      Home Medications    Prior to Admission medications   Medication Sig Start Date End Date Taking? Authorizing Provider  ALPRAZolam [name]) 0.5 MG tablet Take 0.25-0.5 mg by mouth 3 (three) times daily as needed for anxiety.  08/07/16   [provider]  amoxicillin-clavulanate (AUGMENTIN) 875-125 MG tablet Take 1 tablet by mouth 2 (two) times daily. One po bid x 7 days 05/21/18   Rolan Bucco, MD  cetirizine (ZYRTEC) 10 MG tablet Take 10 mg by mouth daily.    [provider]  cyclobenzaprine (FLEXERIL) 10 MG tablet Take 1 tablet (10 mg total) by mouth 2 (two) times daily as needed for muscle spasms. 09/07/16   Dowless, Lelon Mast Tripp, PA-C  doxycycline (VIBRAMYCIN) 100 MG capsule Take 1 capsule (100 mg total) by mouth 2 (two) times daily. One po bid x 7 days 05/21/18   Rolan Bucco, MD  fluconazole (DIFLUCAN) 150 MG tablet Take 1 tablet (150 mg total) by mouth once as needed (yeast infection after antibiotics). Take after you complete antibiotics. Patient not taking: Reported on 05/18/2017 04/17/17   Cleora Fleet, MD  fluticasone (FLONASE) 50 MCG/ACT nasal spray  Place 1 spray into both nostrils daily as needed for allergies. 06/16/16   [provider]  HYDROcodone-acetaminophen (NORCO) 10-325 MG tablet Take 1 tablet by mouth every 6 (six) hours as needed for pain. 07/08/16   [provider]  insulin aspart protamine - aspart (NOVOLOG 70/30 MIX) (70-30) 100 UNIT/ML FlexPen Take 35 units with breakfast and take 30 units with supper everyday. Patient not taking: Reported on 05/18/2017 04/17/17   Standley Dakins L, MD  insulin lispro protamine-lispro (HUMALOG 75/25 MIX) (75-25) 100 UNIT/ML SUSP  injection Inject 10 Units into the skin.    [provider]  Insulin Pen Needle 31G X 5 MM MISC 1 Units by Does not apply route as directed. 04/17/17   Johnson, Clanford L, MD  lisinopril (PRINIVIL,ZESTRIL) 20 MG tablet Take 20 mg by mouth daily.    [provider]  LUMIGAN 0.01 % SOLN Place 1 drop into both eyes every evening. 06/04/16   [provider]  metFORMIN (GLUCOPHAGE XR) 500 MG 24 hr tablet Take 2 tabs with breakfast and 2 tabs with supper everyday 04/17/17   Cleora Fleet, MD  omeprazole (PRILOSEC) 20 MG capsule Take 20 mg by mouth 2 (two) times daily before a meal.  07/08/16   [provider]  PAZEO 0.7 % SOLN Place 1 drop into both eyes daily. 06/16/16   [provider]  predniSONE (DELTASONE) 20 MG tablet 2 tabs po daily x 4 days 05/21/18   Rolan Bucco, MD  PROAIR HFA 108 [phone] Base) MCG/ACT inhaler Inhale 1 puff into the lungs every 6 (six) hours as needed for wheezing. 06/08/16   [provider]  triamcinolone cream (KENALOG) 0.1 % Apply 1 application topically daily. 01/14/17   [provider]  Vitamin D, Ergocalciferol, (DRISDOL) 50000 units CAPS capsule Take 50,000 Units by mouth every Tuesday. 02/09/17   [provider]  zafirlukast (ACCOLATE) 20 MG tablet Take 20 mg by mouth every evening. 06/16/16   [provider]    Family History Family History  Problem Relation Age of Onset  . Hypertension Mother   . Diabetes Mother   . Hypertension Father   . Diabetes Father   . Colon cancer Father     Social History Social History   Tobacco Use  . Smoking status: Current Every Day Smoker  . Smokeless tobacco: Never Used  . Tobacco comment: Hookah - most days  Substance Use Topics  . Alcohol use: No  . Drug use: No     Allergies   Erythromycin and Aspirin   Review of Systems Review of Systems  Constitutional: Negative for chills and fever.  HENT: Negative for congestion and rhinorrhea.     Eyes: Negative for redness and visual disturbance.  Respiratory: Negative for shortness of breath and wheezing.   Cardiovascular: Negative for chest pain and palpitations.  Gastrointestinal: Negative for nausea and vomiting.  Genitourinary: Negative for dysuria and urgency.  Musculoskeletal: Negative for arthralgias and myalgias.  Skin: Negative for pallor and wound.  Neurological: Positive for headaches. Negative for dizziness.     Physical Exam Updated Vital Signs BP (!) 178/98 (BP Location: Right Arm)   Pulse 100   Temp 98.4 F (36.9 C) (Oral)   Resp 16   Ht  (1.626 m)   Wt 96.6 kg   SpO2 96%   BMI 36.56 kg/m   Physical Exam Vitals signs and nursing note reviewed.  Constitutional:      General: She is not in  acute distress.    Appearance: She is well-developed. She is not diaphoretic.  HENT:     Head: Normocephalic and atraumatic.  Eyes:     Pupils: Pupils are equal, round, and reactive to light.  Neck:     Musculoskeletal: Normal range of motion and neck supple.  Cardiovascular:     Rate and Rhythm: Normal rate and regular rhythm.     Heart sounds: No murmur. No friction rub. No gallop.   Pulmonary:     Effort: Pulmonary effort is normal.     Breath sounds: No wheezing or rales.  Abdominal:     General: There is no distension.     Palpations: Abdomen is soft.     Tenderness: There is no abdominal tenderness.  Musculoskeletal:        General: No tenderness.  Skin:    General: Skin is warm and dry.  Neurological:     Mental Status: She is alert and oriented to person, place, and time.     Cranial Nerves: Cranial nerves are intact.     Sensory: Sensation is intact.     Motor: Motor function is intact.     Coordination: Coordination is intact.     Gait: Gait is intact.  Psychiatric:        Behavior: Behavior normal.      ED Treatments / Results  Labs (all labs ordered are listed, but only abnormal results are displayed) Labs Reviewed - No data  to display  EKG None  Radiology No results found.  Procedures Procedures (including critical care time)  Medications Ordered in ED Medications  prochlorperazine (COMPAZINE) injection 10 mg (10 mg Intramuscular Given 02/21/19 0629)  diphenhydrAMINE (BENADRYL) injection 25 mg (25 mg Intramuscular Given 02/21/19 0630)  dexamethasone (DECADRON) tablet 10 mg (10 mg Oral Given 02/21/19 0630)     Initial Impression / Assessment and Plan / ED Course  I have reviewed the triage vital signs and the nursing notes.  Pertinent labs & imaging results that were available during my care of the patient were reviewed by me and considered in my medical decision making (see chart for details).        58 yo F with a chief complaint of a headache.  Going on for the past couple days.  Feels like her prior.  Benign neuro exam.  Will give a headache cocktail and reassess.   Feeling much better on reassessment.  D/c home.   7:08 AM:  I have discussed the diagnosis/risks/treatment options with the patient and family and believe the pt to be eligible for discharge home to follow-up with PCP. We also discussed returning to the ED immediately if new or worsening sx occur. We discussed the sx which are most concerning (e.g., sudden worsening pain, fever, inability to tolerate by mouth) that necessitate immediate return. Medications administered to the patient during their visit and any new prescriptions provided to the patient are listed below.  Medications given during this visit Medications  prochlorperazine (COMPAZINE) injection 10 mg (10 mg Intramuscular Given 02/21/19 0629)  diphenhydrAMINE (BENADRYL) injection 25 mg (25 mg Intramuscular Given 02/21/19 0630)  dexamethasone (DECADRON) tablet 10 mg (10 mg Oral Given 02/21/19 0630)     The patient appears reasonably screen and/or stabilized for discharge and I doubt any other medical condition or other [facility] requiring further screening, evaluation, or treatment in  the ED at this time prior to discharge.   Final Clinical Impressions(s) / ED Diagnoses   Final diagnoses:  Bad headache    ED Discharge Orders         Ordered    Ambulatory referral to Neurology     02/21/19 0707           Melene Plan, DO 02/21/19 0708
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
## 2019-02-21 NOTE — Discharge Instructions (Signed)
Follow up with your PCP, return for fever, neck stiffness worsening headache.

## 2019-02-21 NOTE — ED Notes (Signed)
Patient verbalizes understanding of discharge instructions. Opportunity for questioning and answers were provided. Armband removed by staff, pt discharged from ED ambulatory.   

## 2019-03-09 ENCOUNTER — Other Ambulatory Visit: Payer: Self-pay

## 2019-03-09 ENCOUNTER — Encounter (HOSPITAL_COMMUNITY): Payer: Self-pay

## 2019-03-09 ENCOUNTER — Emergency Department (HOSPITAL_COMMUNITY)
Admission: EM | Admit: 2019-03-09 | Discharge: 2019-03-09 | Disposition: A | Discharge status: Home / Self Care | Payer: Medicare Other | Attending: Emergency Medicine | Admitting: Emergency Medicine

## 2019-03-09 DIAGNOSIS — Z79899 Other long term (current) drug therapy: Secondary | ICD-10-CM | POA: Insufficient documentation

## 2019-03-09 DIAGNOSIS — Z139 Encounter for screening, unspecified: Secondary | ICD-10-CM | POA: Diagnosis present

## 2019-03-09 DIAGNOSIS — E119 Type 2 diabetes mellitus without complications: Secondary | ICD-10-CM | POA: Diagnosis not present

## 2019-03-09 DIAGNOSIS — Z794 Long term (current) use of insulin: Secondary | ICD-10-CM | POA: Diagnosis not present

## 2019-03-09 DIAGNOSIS — F17210 Nicotine dependence, cigarettes, uncomplicated: Secondary | ICD-10-CM | POA: Insufficient documentation

## 2019-03-09 DIAGNOSIS — I10 Essential (primary) hypertension: Secondary | ICD-10-CM | POA: Diagnosis not present

## 2019-03-09 DIAGNOSIS — Z119 Encounter for screening for infectious and parasitic diseases, unspecified: Secondary | ICD-10-CM
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
## 2019-03-09 NOTE — Discharge Instructions (Addendum)
There is no indication for covid 19 symptoms in a patient with no symptoms.

## 2019-03-11 ENCOUNTER — Emergency Department (HOSPITAL_COMMUNITY)
Admission: EM | Admit: 2019-03-11 | Discharge: 2019-03-11 | Disposition: A | Discharge status: Home / Self Care | Payer: Medicare Other | Attending: Emergency Medicine | Admitting: Emergency Medicine

## 2019-03-11 ENCOUNTER — Emergency Department (HOSPITAL_COMMUNITY): Payer: Medicare Other

## 2019-03-11 DIAGNOSIS — F1410 Cocaine abuse, uncomplicated: Secondary | ICD-10-CM

## 2019-03-11 DIAGNOSIS — R4182 Altered mental status, unspecified: Secondary | ICD-10-CM | POA: Insufficient documentation

## 2019-03-11 DIAGNOSIS — Z79899 Other long term (current) drug therapy: Secondary | ICD-10-CM | POA: Insufficient documentation

## 2019-03-11 DIAGNOSIS — I10 Essential (primary) hypertension: Secondary | ICD-10-CM | POA: Diagnosis not present

## 2019-03-11 DIAGNOSIS — Z794 Long term (current) use of insulin: Secondary | ICD-10-CM | POA: Insufficient documentation

## 2019-03-11 DIAGNOSIS — E119 Type 2 diabetes mellitus without complications: Secondary | ICD-10-CM | POA: Insufficient documentation

## 2019-03-11 DIAGNOSIS — J45909 Unspecified asthma, uncomplicated: Secondary | ICD-10-CM | POA: Diagnosis not present

## 2019-03-11 DIAGNOSIS — F17210 Nicotine dependence, cigarettes, uncomplicated: Secondary | ICD-10-CM | POA: Diagnosis not present

## 2019-03-11 DIAGNOSIS — F1910 Other psychoactive substance abuse, uncomplicated: Secondary | ICD-10-CM

## 2019-03-11 LAB — CBC
HCT: 50.1 % — ABNORMAL HIGH (ref 36.0–46.0)
HEMOGLOBIN: 16 g/dL — AB (ref 12.0–15.0)
MCH: 27.5 pg (ref 26.0–34.0)
MCHC: 31.9 g/dL (ref 30.0–36.0)
MCV: 86.1 fL (ref 80.0–100.0)
Platelets: 329 10*3/uL (ref 150–400)
RBC: 5.82 MIL/uL — AB (ref 3.87–5.11)
RDW: 14.8 % (ref 11.5–15.5)
WBC: 6.4 10*3/uL (ref 4.0–10.5)
nRBC: 0 % (ref 0.0–0.2)

## 2019-03-11 LAB — RAPID URINE DRUG SCREEN, HOSP PERFORMED
Amphetamines: NOT DETECTED
Barbiturates: NOT DETECTED
Benzodiazepines: POSITIVE — AB
COCAINE: POSITIVE — AB
OPIATES: NOT DETECTED
Tetrahydrocannabinol: NOT DETECTED

## 2019-03-11 LAB — BASIC METABOLIC PANEL
Anion gap: 11 (ref 5–15)
BUN: 9 mg/dL (ref 6–20)
CHLORIDE: 102 mmol/L (ref 98–111)
CO2: 25 mmol/L (ref 22–32)
CREATININE: 0.71 mg/dL (ref 0.44–1.00)
Calcium: 9.5 mg/dL (ref 8.9–10.3)
GFR calc non Af Amer: >60 mL/min (ref >60)
Glucose, Bld: 165 mg/dL — ABNORMAL HIGH (ref 70–99)
Potassium: 3.9 mmol/L (ref 3.5–5.1)
SODIUM: 138 mmol/L (ref 135–145)

## 2019-03-11 LAB — CBG MONITORING, ED: GLUCOSE-CAPILLARY: 143 mg/dL — AB (ref 70–99)

## 2019-03-11 LAB — ETHANOL

## 2019-03-11 LAB — ACETAMINOPHEN LEVEL: Acetaminophen (Tylenol), Serum: <10 ug/mL — ABNORMAL LOW (ref 10–30)

## 2019-03-11 MED ORDER — SODIUM CHLORIDE 0.9 % IV SOLN
INTRAVENOUS | Status: DC
  Administered 2019-03-11: 13:00:00 via INTRAVENOUS

## 2019-03-11 MED ORDER — SODIUM CHLORIDE 0.9 % IV BOLUS
1000.0000 mL | Freq: Once | INTRAVENOUS | Status: AC
  Administered 2019-03-11: 1000 mL via INTRAVENOUS

## 2019-03-11 MED ORDER — NALOXONE HCL 0.4 MG/ML IJ SOLN
0.4000 mg | Freq: Once | INTRAMUSCULAR | Status: AC
  Administered 2019-03-11: 0.4 mg via INTRAVENOUS
  Filled 2019-03-11: qty 1

## 2019-03-11 MED ORDER — ONDANSETRON HCL 4 MG/2ML IJ SOLN
4.0000 mg | Freq: Once | INTRAMUSCULAR | Status: AC
  Administered 2019-03-11: 4 mg via INTRAVENOUS
  Filled 2019-03-11: qty 2

## 2019-03-11 NOTE — ED Notes (Signed)
Pt ambulated to the restroom with one staff stand by assist

## 2019-03-11 NOTE — ED Notes (Signed)
ED Provider at bedside. 
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
## 2019-03-11 NOTE — Discharge Instructions (Signed)
Avoid using illegal substances such as cocaine.  Make sure that you are eating and drinking well.  Try to avoid tobacco.
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
## 2019-03-30 ENCOUNTER — Other Ambulatory Visit (INDEPENDENT_AMBULATORY_CARE_PROVIDER_SITE_OTHER): Payer: Self-pay | Admitting: Ophthalmology

## 2019-03-30 MED ORDER — OLOPATADINE HCL 0.7 % OP SOLN: 1.0000 [drp] | Freq: Every day | OPHTHALMIC | 3 refills | Status: DC

## 2019-03-30 NOTE — Telephone Encounter (Signed)
Pt of [employer]. Called in requesting refill of [place] for seasonal allergies to [facility] pharmacy on file. Medication e-scribed per pt request.  Karie Chimera, M.D., Ph.D. Diseases & Surgery of the Retina and Vitreous [facility]

## 2019-04-10 ENCOUNTER — Other Ambulatory Visit: Payer: Self-pay

## 2019-04-10 ENCOUNTER — Emergency Department (HOSPITAL_COMMUNITY): Payer: Medicare Other

## 2019-04-10 ENCOUNTER — Encounter (HOSPITAL_COMMUNITY): Payer: Self-pay | Admitting: Emergency Medicine

## 2019-04-10 ENCOUNTER — Emergency Department (HOSPITAL_COMMUNITY)
Admission: EM | Admit: 2019-04-10 | Discharge: 2019-04-10 | Disposition: A | Discharge status: Home / Self Care | Payer: Medicare Other | Attending: Emergency Medicine | Admitting: Emergency Medicine

## 2019-04-10 DIAGNOSIS — F419 Anxiety disorder, unspecified: Secondary | ICD-10-CM | POA: Diagnosis not present

## 2019-04-10 DIAGNOSIS — Z8709 Personal history of other diseases of the respiratory system: Secondary | ICD-10-CM | POA: Insufficient documentation

## 2019-04-10 DIAGNOSIS — F17210 Nicotine dependence, cigarettes, uncomplicated: Secondary | ICD-10-CM | POA: Diagnosis not present

## 2019-04-10 DIAGNOSIS — F1490 Cocaine use, unspecified, uncomplicated: Secondary | ICD-10-CM | POA: Diagnosis not present

## 2019-04-10 DIAGNOSIS — E119 Type 2 diabetes mellitus without complications: Secondary | ICD-10-CM | POA: Diagnosis not present

## 2019-04-10 DIAGNOSIS — I10 Essential (primary) hypertension: Secondary | ICD-10-CM | POA: Diagnosis not present

## 2019-04-10 DIAGNOSIS — Z79899 Other long term (current) drug therapy: Secondary | ICD-10-CM | POA: Insufficient documentation

## 2019-04-10 DIAGNOSIS — R0789 Other chest pain: Secondary | ICD-10-CM | POA: Insufficient documentation

## 2019-04-10 DIAGNOSIS — Z794 Long term (current) use of insulin: Secondary | ICD-10-CM | POA: Diagnosis not present

## 2019-04-10 DIAGNOSIS — M79602 Pain in left arm: Secondary | ICD-10-CM

## 2019-04-10 LAB — BASIC METABOLIC PANEL
Anion gap: 11 (ref 5–15)
BUN: 8 mg/dL (ref 6–20)
CO2: 23 mmol/L (ref 22–32)
Calcium: 9.4 mg/dL (ref 8.9–10.3)
Chloride: 106 mmol/L (ref 98–111)
Creatinine, Ser: 0.77 mg/dL (ref 0.44–1.00)
GFR calc Af Amer: >60 mL/min (ref >60)
GFR calc non Af Amer: >60 mL/min (ref >60)
Glucose, Bld: 156 mg/dL — ABNORMAL HIGH (ref 70–99)
Potassium: 3.9 mmol/L (ref 3.5–5.1)
Sodium: 140 mmol/L (ref 135–145)

## 2019-04-10 LAB — CBC
HCT: 45.2 % (ref 36.0–46.0)
Hemoglobin: 14.3 g/dL (ref 12.0–15.0)
MCH: 27.7 pg (ref 26.0–34.0)
MCHC: 31.6 g/dL (ref 30.0–36.0)
MCV: 87.4 fL (ref 80.0–100.0)
Platelets: 366 10*3/uL (ref 150–400)
RBC: 5.17 MIL/uL — ABNORMAL HIGH (ref 3.87–5.11)
RDW: 14.6 % (ref 11.5–15.5)
WBC: 5.6 10*3/uL (ref 4.0–10.5)
nRBC: 0 % (ref 0.0–0.2)

## 2019-04-10 LAB — D-DIMER, QUANTITATIVE: D-Dimer, Quant: 0.29 ug/mL-FEU (ref 0.00–0.50)

## 2019-04-10 LAB — TROPONIN I
Troponin I: <0.03 ng/mL (ref <0.03)
Troponin I: <0.03 ng/mL (ref <0.03)

## 2019-04-10 MED ORDER — FENTANYL CITRATE (PF) 100 MCG/2ML IJ SOLN
50.0000 ug | Freq: Once | INTRAMUSCULAR | Status: AC
  Administered 2019-04-10: 15:00:00 50 ug via INTRAVENOUS
  Filled 2019-04-10: qty 2

## 2019-04-10 MED ORDER — SODIUM CHLORIDE 0.9% FLUSH
3.0000 mL | Freq: Once | INTRAVENOUS | Status: AC
  Administered 2019-04-10: 3 mL via INTRAVENOUS

## 2019-04-10 MED ORDER — ONDANSETRON HCL 4 MG/2ML IJ SOLN
4.0000 mg | Freq: Once | INTRAMUSCULAR | Status: AC
  Administered 2019-04-10: 15:00:00 4 mg via INTRAVENOUS
  Filled 2019-04-10: qty 2

## 2019-04-10 MED ORDER — CYCLOBENZAPRINE HCL 10 MG PO TABS: 10.0000 mg | ORAL_TABLET | Freq: Two times a day (BID) | ORAL | 0 refills | Status: DC | PRN

## 2019-04-10 NOTE — ED Notes (Signed)
Pt transported to xray 
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
## 2019-04-10 NOTE — ED Notes (Signed)
Patient verbalizes understanding of discharge instructions. Opportunity for questioning and answers were provided. Armband removed by staff, pt discharged from ED.  
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
## 2019-04-10 NOTE — ED Notes (Signed)
Main Lab is going to run [facility]

## 2019-04-10 NOTE — Discharge Instructions (Signed)
Follow up with your pain doctor for increase pain control if needed. I have ordered flexeril for spasm and pain relief. This may be coming form you neck.

## 2019-05-09 ENCOUNTER — Encounter (HOSPITAL_COMMUNITY): Payer: Self-pay | Admitting: Emergency Medicine

## 2019-05-09 ENCOUNTER — Other Ambulatory Visit: Payer: Self-pay

## 2019-05-09 ENCOUNTER — Emergency Department (HOSPITAL_COMMUNITY)
Admission: EM | Admit: 2019-05-09 | Discharge: 2019-05-09 | Disposition: A | Discharge status: Home / Self Care | Payer: Medicare Other | Attending: Emergency Medicine | Admitting: Emergency Medicine

## 2019-05-09 ENCOUNTER — Emergency Department (HOSPITAL_COMMUNITY): Payer: Medicare Other

## 2019-05-09 DIAGNOSIS — S301XXA Contusion of abdominal wall, initial encounter: Secondary | ICD-10-CM | POA: Diagnosis not present

## 2019-05-09 DIAGNOSIS — M79602 Pain in left arm: Secondary | ICD-10-CM | POA: Insufficient documentation

## 2019-05-09 DIAGNOSIS — Y999 Unspecified external cause status: Secondary | ICD-10-CM | POA: Insufficient documentation

## 2019-05-09 DIAGNOSIS — M7918 Myalgia, other site: Secondary | ICD-10-CM

## 2019-05-09 DIAGNOSIS — Y92009 Unspecified place in unspecified non-institutional (private) residence as the place of occurrence of the external cause: Secondary | ICD-10-CM | POA: Insufficient documentation

## 2019-05-09 DIAGNOSIS — R51 Headache: Secondary | ICD-10-CM | POA: Insufficient documentation

## 2019-05-09 DIAGNOSIS — M546 Pain in thoracic spine: Secondary | ICD-10-CM | POA: Insufficient documentation

## 2019-05-09 DIAGNOSIS — M542 Cervicalgia: Secondary | ICD-10-CM | POA: Insufficient documentation

## 2019-05-09 DIAGNOSIS — R1032 Left lower quadrant pain: Secondary | ICD-10-CM | POA: Diagnosis not present

## 2019-05-09 DIAGNOSIS — Y939 Activity, unspecified: Secondary | ICD-10-CM | POA: Diagnosis not present

## 2019-05-09 DIAGNOSIS — Z0471 Encounter for examination and observation following alleged adult physical abuse: Secondary | ICD-10-CM | POA: Diagnosis present

## 2019-05-09 DIAGNOSIS — M79601 Pain in right arm: Secondary | ICD-10-CM | POA: Insufficient documentation

## 2019-05-09 MED ORDER — HYDROCODONE-ACETAMINOPHEN 5-325 MG PO TABS
1.0000 | ORAL_TABLET | Freq: Once | ORAL | Status: AC
  Administered 2019-05-09: 1 via ORAL
  Filled 2019-05-09: qty 1

## 2019-05-09 NOTE — ED Notes (Signed)
Cab voucher and resources given to pt.
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
## 2019-05-09 NOTE — ED Notes (Signed)
[facility] called @ 0927-per Vance Peper, RN-called by Marylene Land

## 2019-05-09 NOTE — Discharge Instructions (Signed)
Return to ER as needed.
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
## 2019-05-09 NOTE — ED Notes (Signed)
Pt snoring. Pt in no apparent distress. Equal chest rise and fall. Will continue to monitor.

## 2019-05-10 ENCOUNTER — Other Ambulatory Visit: Payer: Self-pay

## 2019-05-10 ENCOUNTER — Encounter (HOSPITAL_COMMUNITY): Payer: Self-pay | Admitting: Emergency Medicine

## 2019-05-10 ENCOUNTER — Emergency Department (HOSPITAL_COMMUNITY)
Admission: EM | Admit: 2019-05-10 | Discharge: 2019-05-10 | Disposition: A | Discharge status: Home / Self Care | Payer: Medicare Other | Attending: Emergency Medicine | Admitting: Emergency Medicine

## 2019-05-10 DIAGNOSIS — Y92018 Other place in single-family (private) house as the place of occurrence of the external cause: Secondary | ICD-10-CM | POA: Diagnosis not present

## 2019-05-10 DIAGNOSIS — Y998 Other external cause status: Secondary | ICD-10-CM | POA: Diagnosis not present

## 2019-05-10 DIAGNOSIS — Z794 Long term (current) use of insulin: Secondary | ICD-10-CM | POA: Insufficient documentation

## 2019-05-10 DIAGNOSIS — J45909 Unspecified asthma, uncomplicated: Secondary | ICD-10-CM | POA: Diagnosis not present

## 2019-05-10 DIAGNOSIS — Z79899 Other long term (current) drug therapy: Secondary | ICD-10-CM | POA: Diagnosis not present

## 2019-05-10 DIAGNOSIS — S81012A Laceration without foreign body, left knee, initial encounter: Secondary | ICD-10-CM | POA: Diagnosis not present

## 2019-05-10 DIAGNOSIS — W25XXXA Contact with sharp glass, initial encounter: Secondary | ICD-10-CM | POA: Diagnosis not present

## 2019-05-10 DIAGNOSIS — F1410 Cocaine abuse, uncomplicated: Secondary | ICD-10-CM | POA: Insufficient documentation

## 2019-05-10 DIAGNOSIS — Z23 Encounter for immunization: Secondary | ICD-10-CM | POA: Diagnosis not present

## 2019-05-10 DIAGNOSIS — I10 Essential (primary) hypertension: Secondary | ICD-10-CM | POA: Insufficient documentation

## 2019-05-10 DIAGNOSIS — Y9389 Activity, other specified: Secondary | ICD-10-CM | POA: Insufficient documentation

## 2019-05-10 DIAGNOSIS — F17210 Nicotine dependence, cigarettes, uncomplicated: Secondary | ICD-10-CM | POA: Diagnosis not present

## 2019-05-10 DIAGNOSIS — S8992XA Unspecified injury of left lower leg, initial encounter: Secondary | ICD-10-CM | POA: Diagnosis present

## 2019-05-10 DIAGNOSIS — E119 Type 2 diabetes mellitus without complications: Secondary | ICD-10-CM | POA: Insufficient documentation

## 2019-05-10 MED ORDER — TETANUS-DIPHTH-ACELL PERTUSSIS 5-2.5-18.5 LF-MCG/0.5 IM SUSP
0.5000 mL | Freq: Once | INTRAMUSCULAR | Status: AC
  Administered 2019-05-10: 0.5 mL via INTRAMUSCULAR
  Filled 2019-05-10: qty 0.5

## 2019-05-10 MED ORDER — LIDOCAINE 5 % EX PTCH
1.0000 | MEDICATED_PATCH | CUTANEOUS | Status: DC
  Administered 2019-05-10: 1 via TRANSDERMAL
  Filled 2019-05-10: qty 1

## 2019-05-10 MED ORDER — LIDOCAINE-EPINEPHRINE (PF) 2 %-1:200000 IJ SOLN
20.0000 mL | Freq: Once | INTRAMUSCULAR | Status: AC
  Administered 2019-05-10: 10 mL via INTRADERMAL
  Filled 2019-05-10: qty 20

## 2019-05-10 MED ORDER — HYDROCODONE-ACETAMINOPHEN 5-325 MG PO TABS
2.0000 | ORAL_TABLET | Freq: Once | ORAL | Status: AC
  Administered 2019-05-10: 2 via ORAL
  Filled 2019-05-10: qty 2
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
## 2019-05-10 NOTE — ED Notes (Signed)
Bed: WA03 Expected date:  Expected time:  Means of arrival:  Comments: 
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
## 2019-06-25 ENCOUNTER — Emergency Department (HOSPITAL_COMMUNITY)
Admission: EM | Admit: 2019-06-25 | Discharge: 2019-06-25 | Disposition: A | Discharge status: Home / Self Care | Payer: Medicare Other | Attending: Emergency Medicine | Admitting: Emergency Medicine

## 2019-06-25 ENCOUNTER — Emergency Department (HOSPITAL_COMMUNITY): Payer: Medicare Other

## 2019-06-25 ENCOUNTER — Encounter (HOSPITAL_COMMUNITY): Payer: Self-pay

## 2019-06-25 ENCOUNTER — Other Ambulatory Visit: Payer: Self-pay

## 2019-06-25 DIAGNOSIS — E119 Type 2 diabetes mellitus without complications: Secondary | ICD-10-CM | POA: Diagnosis not present

## 2019-06-25 DIAGNOSIS — Z794 Long term (current) use of insulin: Secondary | ICD-10-CM | POA: Insufficient documentation

## 2019-06-25 DIAGNOSIS — M25571 Pain in right ankle and joints of right foot: Secondary | ICD-10-CM | POA: Insufficient documentation

## 2019-06-25 DIAGNOSIS — J45909 Unspecified asthma, uncomplicated: Secondary | ICD-10-CM | POA: Insufficient documentation

## 2019-06-25 DIAGNOSIS — I10 Essential (primary) hypertension: Secondary | ICD-10-CM | POA: Diagnosis not present

## 2019-06-25 DIAGNOSIS — Z79899 Other long term (current) drug therapy: Secondary | ICD-10-CM | POA: Diagnosis not present

## 2019-06-25 DIAGNOSIS — F17210 Nicotine dependence, cigarettes, uncomplicated: Secondary | ICD-10-CM | POA: Diagnosis not present

## 2019-06-25 NOTE — ED Notes (Signed)
Ortho is aware of order.  

## 2019-06-25 NOTE — Discharge Instructions (Signed)
The x-rays of your ankle and your foot today did not show any evidence of a fracture to your bone.  You likely have a contusion to your foot/ankle area.  You may take over-the-counter pain medications to help with your symptoms.  Keep the foot elevated to help reduce swelling.  You may also use cool compresses to help with the swelling and pain.  Please follow up with your primary care provider within 5-7 days for re-evaluation of your symptoms. If you do not have a primary care provider, information for a healthcare clinic has been provided for you to make arrangements for follow up care. Please return to the emergency department for any new or worsening symptoms.
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
## 2019-08-19 ENCOUNTER — Emergency Department (HOSPITAL_BASED_OUTPATIENT_CLINIC_OR_DEPARTMENT_OTHER): Payer: Medicare Other

## 2019-08-19 ENCOUNTER — Other Ambulatory Visit: Payer: Self-pay

## 2019-08-19 ENCOUNTER — Emergency Department (HOSPITAL_BASED_OUTPATIENT_CLINIC_OR_DEPARTMENT_OTHER)
Admission: EM | Admit: 2019-08-19 | Discharge: 2019-08-19 | Disposition: A | Discharge status: Home / Self Care | Payer: Medicare Other | Attending: Emergency Medicine | Admitting: Emergency Medicine

## 2019-08-19 ENCOUNTER — Encounter (HOSPITAL_BASED_OUTPATIENT_CLINIC_OR_DEPARTMENT_OTHER): Payer: Self-pay

## 2019-08-19 DIAGNOSIS — E119 Type 2 diabetes mellitus without complications: Secondary | ICD-10-CM | POA: Insufficient documentation

## 2019-08-19 DIAGNOSIS — Y998 Other external cause status: Secondary | ICD-10-CM | POA: Insufficient documentation

## 2019-08-19 DIAGNOSIS — F17210 Nicotine dependence, cigarettes, uncomplicated: Secondary | ICD-10-CM | POA: Insufficient documentation

## 2019-08-19 DIAGNOSIS — M542 Cervicalgia: Secondary | ICD-10-CM | POA: Insufficient documentation

## 2019-08-19 DIAGNOSIS — S299XXA Unspecified injury of thorax, initial encounter: Secondary | ICD-10-CM | POA: Diagnosis not present

## 2019-08-19 DIAGNOSIS — Y9389 Activity, other specified: Secondary | ICD-10-CM | POA: Diagnosis not present

## 2019-08-19 DIAGNOSIS — I10 Essential (primary) hypertension: Secondary | ICD-10-CM | POA: Insufficient documentation

## 2019-08-19 DIAGNOSIS — Y92009 Unspecified place in unspecified non-institutional (private) residence as the place of occurrence of the external cause: Secondary | ICD-10-CM | POA: Insufficient documentation

## 2019-08-19 DIAGNOSIS — Z79899 Other long term (current) drug therapy: Secondary | ICD-10-CM | POA: Insufficient documentation

## 2019-08-19 DIAGNOSIS — M549 Dorsalgia, unspecified: Secondary | ICD-10-CM

## 2019-08-19 NOTE — ED Notes (Signed)
Patient transported to X-ray 

## 2019-08-19 NOTE — ED Notes (Signed)
No answer at number provided for [facility] after multiple attempts

## 2019-08-19 NOTE — ED Notes (Signed)
MD at bedside. 

## 2019-08-19 NOTE — ED Notes (Signed)
Pt moved to Hall2 to complete calls for safe place to stay for discharge
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
## 2019-08-19 NOTE — ED Notes (Signed)
Pt states attempting to call number provided but no answer.  Attempting to call again.
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
## 2019-08-19 NOTE — ED Notes (Signed)
Case mangager returned call, speaking to patient on phone
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
## 2019-08-19 NOTE — ED Notes (Signed)
Pt provided meal and beverage. Continues to make attempts to find a place to go for tonight

## 2019-08-19 NOTE — ED Notes (Signed)
Received call from social work

## 2019-08-19 NOTE — ED Notes (Signed)
Meal provided.  Pt has called multiple family members awaiting return call to try to find place to stay tonight.
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
## 2019-08-19 NOTE — ED Notes (Signed)
C-Collar removed by Dr Alvino Chapel with physical exam.

## 2019-08-19 NOTE — ED Notes (Signed)
Pt trying to call family in [place] for place to stay tonight
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
## 2019-08-20 ENCOUNTER — Telehealth: Payer: Self-pay | Admitting: *Deleted
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
## 2019-10-02 IMAGING — DX CHEST - 2 VIEW
2 series · 2 of 2 positions shown · non-contrast
Comparison: May 28, 2018

CLINICAL DATA: Chest pain

EXAM:
CHEST - 2 VIEW

[w chest pa]
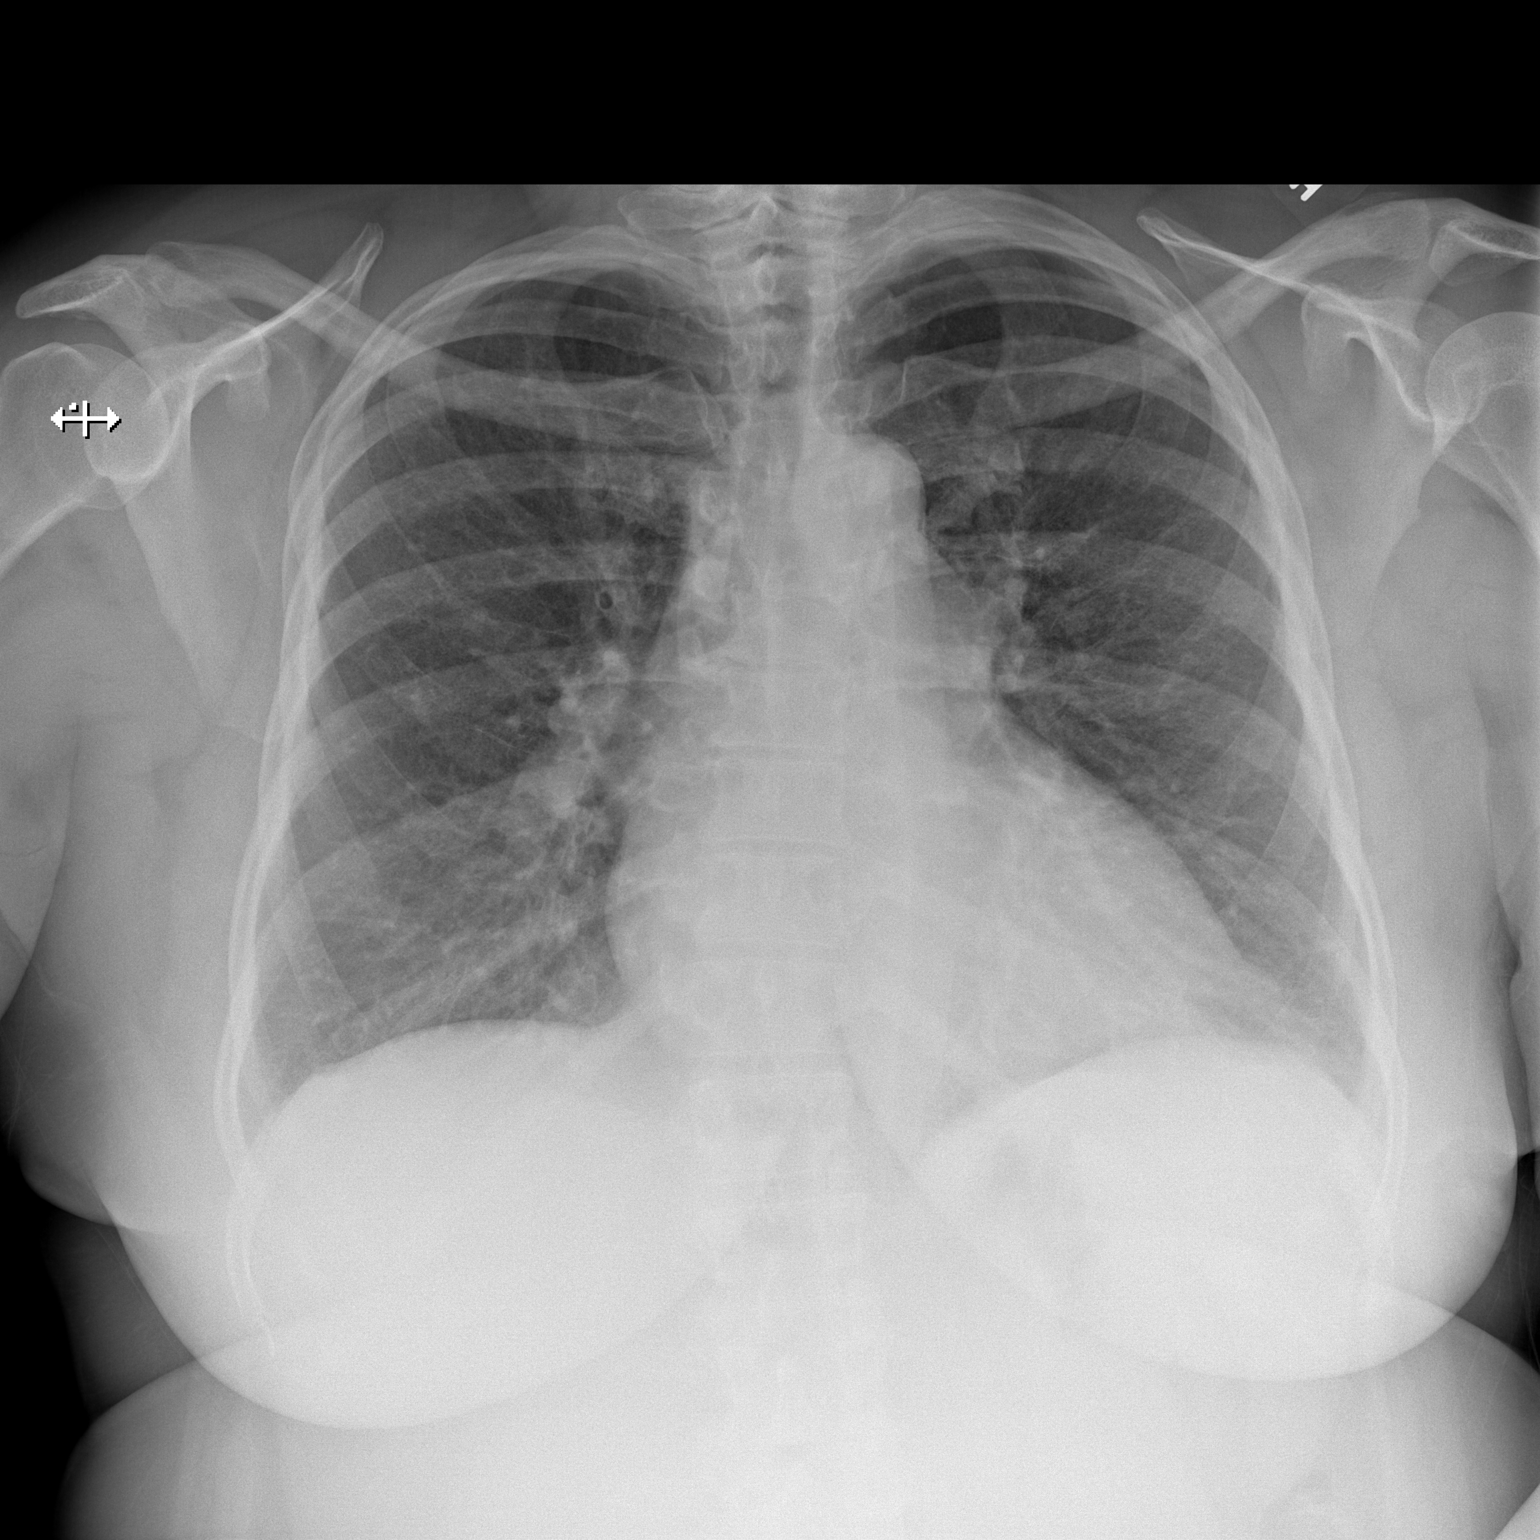

[w chest lat]
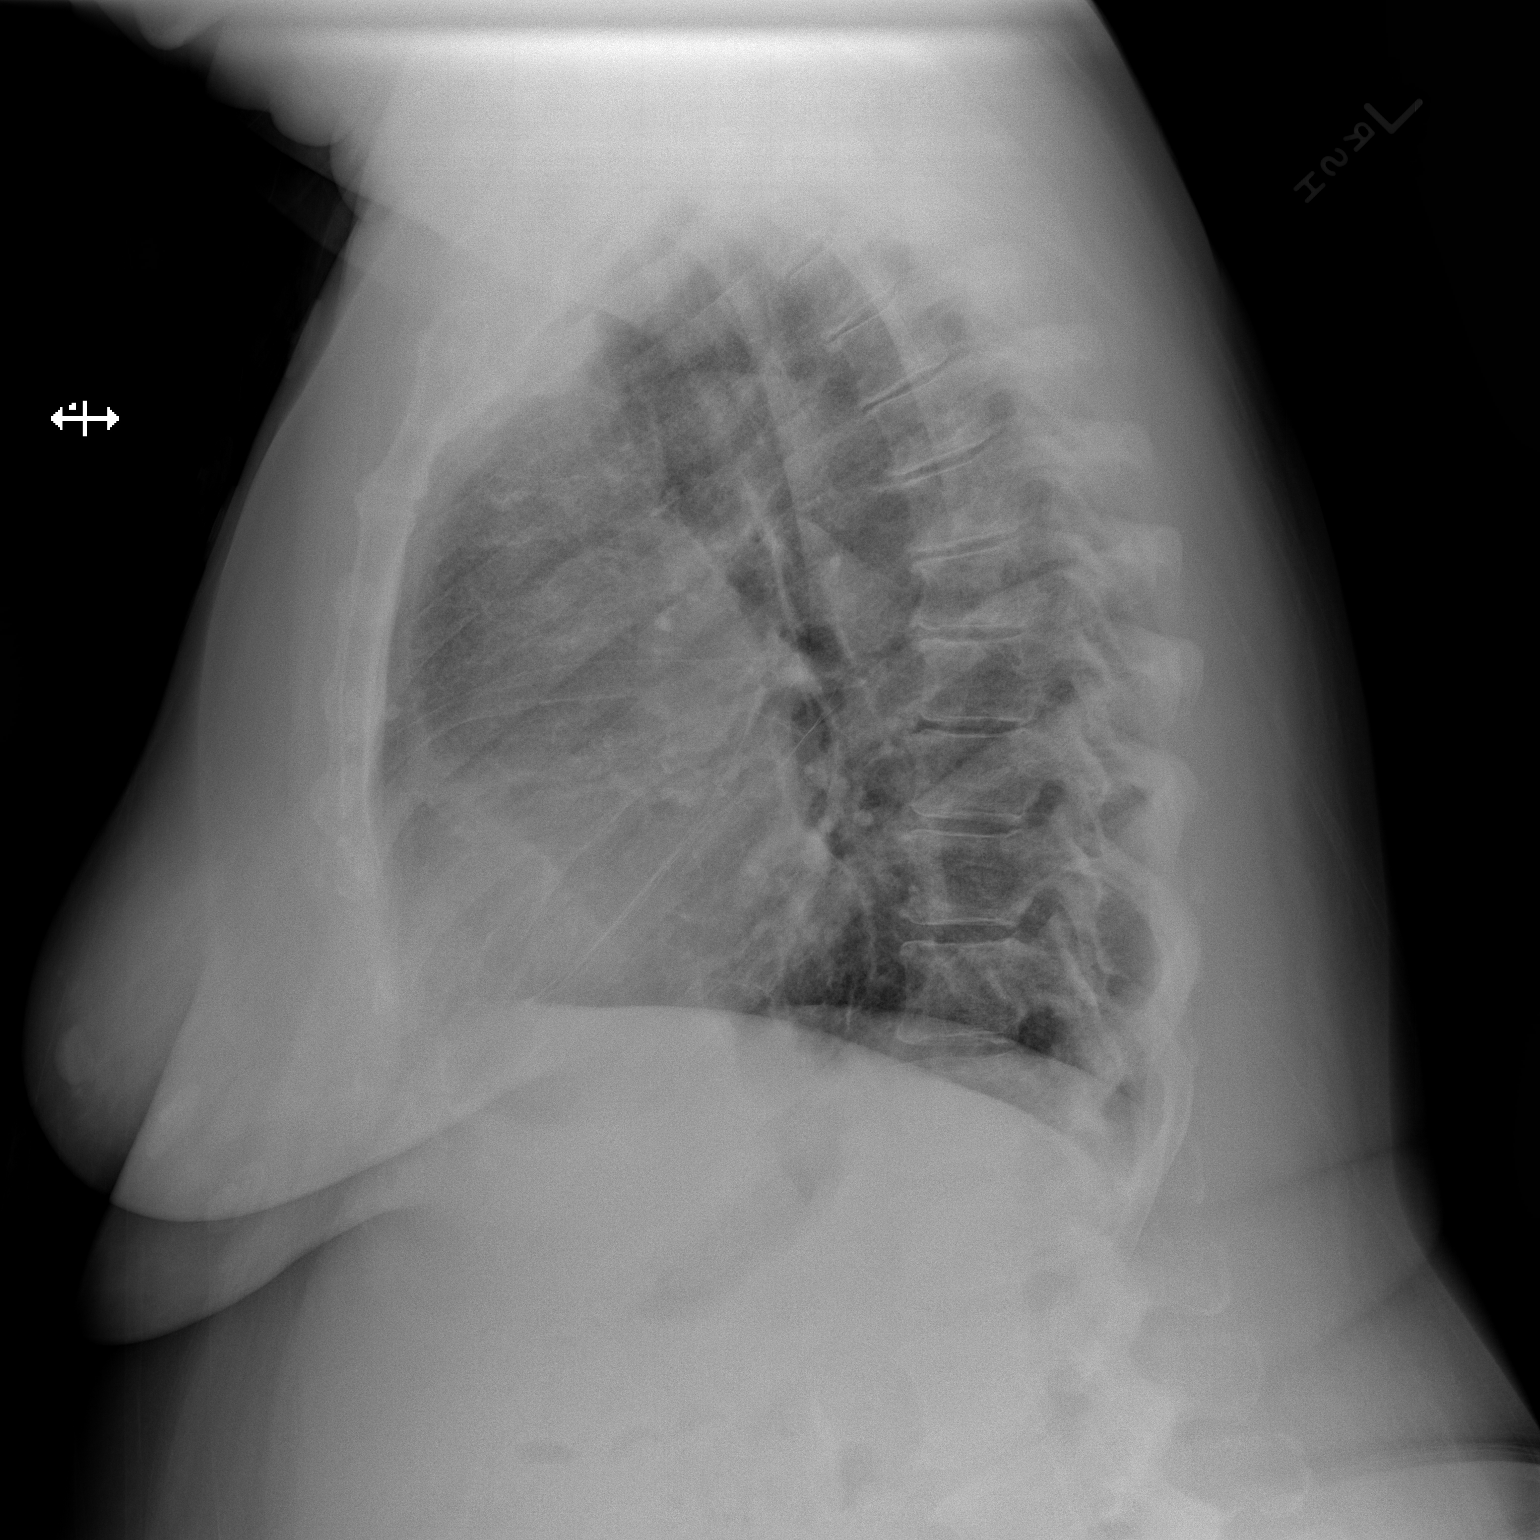

[2 of 2 positions shown; findings below may reference images not displayed]

FINDINGS: There is no edema or consolidation. Heart is upper normal in size
with pulmonary vascularity normal. No adenopathy. There is
degenerative change in thoracic spine.
IMPRESSION: No edema or consolidation.  Heart upper normal in size.
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
## 2020-02-19 ENCOUNTER — Ambulatory Visit (HOSPITAL_COMMUNITY)
Admission: EM | Admit: 2020-02-19 | Discharge: 2020-02-19 | Disposition: A | Discharge status: Home / Self Care | Payer: Medicare Other | Attending: Family Medicine | Admitting: Family Medicine

## 2020-02-19 ENCOUNTER — Other Ambulatory Visit: Payer: Self-pay

## 2020-02-19 ENCOUNTER — Encounter (HOSPITAL_COMMUNITY): Payer: Self-pay

## 2020-02-19 DIAGNOSIS — J45909 Unspecified asthma, uncomplicated: Secondary | ICD-10-CM | POA: Insufficient documentation

## 2020-02-19 DIAGNOSIS — E119 Type 2 diabetes mellitus without complications: Secondary | ICD-10-CM | POA: Diagnosis not present

## 2020-02-19 DIAGNOSIS — J019 Acute sinusitis, unspecified: Secondary | ICD-10-CM | POA: Diagnosis present

## 2020-02-19 DIAGNOSIS — Z79899 Other long term (current) drug therapy: Secondary | ICD-10-CM | POA: Diagnosis not present

## 2020-02-19 DIAGNOSIS — U071 COVID-19: Secondary | ICD-10-CM | POA: Diagnosis not present

## 2020-02-19 DIAGNOSIS — I10 Essential (primary) hypertension: Secondary | ICD-10-CM | POA: Insufficient documentation

## 2020-02-19 DIAGNOSIS — F419 Anxiety disorder, unspecified: Secondary | ICD-10-CM | POA: Insufficient documentation

## 2020-02-19 DIAGNOSIS — Z794 Long term (current) use of insulin: Secondary | ICD-10-CM | POA: Insufficient documentation

## 2020-02-19 DIAGNOSIS — Z7901 Long term (current) use of anticoagulants: Secondary | ICD-10-CM | POA: Diagnosis not present

## 2020-02-19 DIAGNOSIS — H409 Unspecified glaucoma: Secondary | ICD-10-CM | POA: Insufficient documentation

## 2020-02-19 DIAGNOSIS — K5900 Constipation, unspecified: Secondary | ICD-10-CM | POA: Insufficient documentation

## 2020-02-19 DIAGNOSIS — F17210 Nicotine dependence, cigarettes, uncomplicated: Secondary | ICD-10-CM | POA: Insufficient documentation

## 2020-02-19 DIAGNOSIS — R05 Cough: Secondary | ICD-10-CM | POA: Diagnosis not present

## 2020-02-19 MED ORDER — BENZONATATE 200 MG PO CAPS: 200.0000 mg | ORAL_CAPSULE | Freq: Three times a day (TID) | ORAL | 0 refills | Status: AC | PRN

## 2020-02-19 MED ORDER — AMOXICILLIN-POT CLAVULANATE 875-125 MG PO TABS: 1.0000 | ORAL_TABLET | Freq: Two times a day (BID) | ORAL | 0 refills | Status: AC

## 2020-02-19 MED ORDER — FLUTICASONE PROPIONATE 50 MCG/ACT NA SUSP: 1.0000 | Freq: Every day | NASAL | 0 refills | Status: DC

## 2020-02-19 NOTE — Discharge Instructions (Signed)
COVID swab pending Begin Augmentin twice daily for 1 week Continue Claritin Flonase nasal spray 1-2 each nostril daily Tessalon every 8 hours for cough

## 2020-02-19 NOTE — ED Provider Notes (Signed)
[facility]    CSN: [id number] Arrival date & time: 02/19/20  0808      History   Chief Complaint Chief Complaint  Patient presents with  . URI    HPI Sara Juarez is a 59 y.o. female history of hypertension, DM type II, asthma, presenting today for evaluation of sinus congestion and pressure.  Patient states that over the past week she has had a lot of nasal congestion and sinus pressure.  She also reports a cough.  Main concern is symptoms in sinuses.  Feels he has minimal symptoms in her lungs.  Denies chest pain or shortness of breath.  Denies fevers.  Denies known exposure to Covid.  Reports history of prior sinus surgery in 2010.  Has had minimal sinus problems since.  Denies GI symptoms.  Taking Claritin.  HPI  Past Medical History:  Diagnosis Date  . Anxiety   . Asthma   . Cervical herniated disc   . Diabetes mellitus without complication (HCC)   . Glaucoma (increased eye pressure)   . HTN (hypertension) 02/21/2017    Patient Active Problem List   Diagnosis Date Noted  . E coli bacteremia 04/16/2017  . Sepsis secondary to UTI (HCC) 04/15/2017  . DM (diabetes mellitus), type 2, uncontrolled (HCC) 04/15/2017  . Chronic pain 04/15/2017  . Constipation 04/15/2017  . Pyelonephritis 02/21/2017  . Vaginal discharge 02/21/2017  . Sepsis (HCC) 02/21/2017  . DM2 (diabetes mellitus, type 2) (HCC) 02/21/2017  . HTN (hypertension) 02/21/2017    Past Surgical History:  Procedure Laterality Date  . BILATERAL CARPAL TUNNEL RELEASE    . BLADDER SUSPENSION    . NASAL SINUS SURGERY    . PARTIAL HYSTERECTOMY      OB History   No obstetric history on file.      Home Medications    Prior to Admission medications   Medication Sig Start Date End Date Taking? Authorizing Provider  ALPRAZolam [name]) 1 MG tablet Take 1 mg by mouth 2 (two) times daily as needed for anxiety.  08/07/16   [provider]  amoxicillin-clavulanate (AUGMENTIN) 875-125 MG  tablet Take 1 tablet by mouth every 12 (twelve) hours for 7 days. 02/19/20 02/26/20  Merikay Lesniewski C, PA-C  benzonatate (TESSALON) 200 MG capsule Take 1 capsule (200 mg total) by mouth 3 (three) times daily as needed for up to 7 days for cough. 02/19/20 02/26/20  Carrel Leather C, PA-C  cetirizine (ZYRTEC) 10 MG tablet Take 10 mg by mouth daily.    [provider]  cyclobenzaprine (FLEXERIL) 10 MG tablet Take 1 tablet (10 mg total) by mouth 2 (two) times daily as needed for muscle spasms. 04/10/19   Harris, Abigail, PA-C  fluticasone (FLONASE) 50 MCG/ACT nasal spray Place 1-2 sprays into both nostrils daily for 7 days. 02/19/20 02/26/20  Latishia Suitt C, PA-C  HYDROcodone-acetaminophen (NORCO) 10-325 MG tablet Take 1 tablet by mouth every 6 (six) hours as needed for pain. 07/08/16   [provider]  insulin lispro protamine-lispro (HUMALOG 75/25 MIX) (75-25) 100 UNIT/ML SUSP injection Inject 10 Units into the skin See admin instructions. Uses only when sugar goes over 150.    [provider]  Insulin Pen Needle 31G X 5 MM MISC 1 Units by Does not apply route as directed. 04/17/17   Johnson, Clanford L, MD  lisinopril (PRINIVIL,ZESTRIL) 20 MG tablet Take 20 mg by mouth daily.    [provider]  LUMIGAN 0.01 % SOLN Place 1 drop  into both eyes every evening. 06/04/16   [provider]  Olopatadine HCl (PAZEO) 0.7 % SOLN Apply 1 drop to eye daily. Patient taking differently: Place 1 drop into both eyes daily.  03/30/19   Rennis Chris, MD  omeprazole (PRILOSEC) 20 MG capsule Take 20 mg by mouth 3 (three) times daily.  07/08/16   [provider]  PROAIR HFA 108 (90 Base) MCG/ACT inhaler Inhale 1 puff into the lungs every 6 (six) hours as needed for wheezing. 06/08/16   [provider]  triamcinolone cream (KENALOG) 0.1 % Apply 1 application topically daily as needed (on affected areas(s) on skin).  01/14/17   [provider]  Vitamin D,  Ergocalciferol, (DRISDOL) 50000 units CAPS capsule Take 50,000 Units by mouth every Tuesday. 02/09/17   [provider]  insulin aspart protamine - aspart (NOVOLOG 70/30 MIX) (70-30) 100 UNIT/ML FlexPen Take 35 units with breakfast and take 30 units with supper everyday. Patient not taking: Reported on 05/18/2017 04/17/17 02/19/20  Cleora Fleet, MD  metFORMIN (GLUCOPHAGE XR) 500 MG 24 hr tablet Take 2 tabs with breakfast and 2 tabs with supper everyday Patient not taking: Reported on 04/10/2019 04/17/17 02/19/20  Cleora Fleet, MD    Family History Family History  Problem Relation Age of Onset  . Hypertension Mother   . Diabetes Mother   . Hypertension Father   . Diabetes Father   . Colon cancer Father     Social History Social History   Tobacco Use  . Smoking status: Current Every Day Smoker    Packs/day: 0.75    Types: Cigarettes  . Smokeless tobacco: Never Used  Substance Use Topics  . Alcohol use: Yes    Alcohol/week: 2.0 standard drinks    Types: 2 Shots of liquor per week    Comment: shots   . Drug use: Yes    Types: Cocaine    Comment: tried crack 2 days ago      Allergies   Erythromycin and Aspirin   Review of Systems Review of Systems  Constitutional: Negative for activity change, appetite change, chills, fatigue and fever.  HENT: Positive for congestion, rhinorrhea and sinus pressure. Negative for ear pain, sore throat and trouble swallowing.   Eyes: Negative for discharge and redness.  Respiratory: Positive for cough. Negative for chest tightness and shortness of breath.   Cardiovascular: Negative for chest pain.  Gastrointestinal: Negative for abdominal pain, diarrhea, nausea and vomiting.  Musculoskeletal: Negative for myalgias.  Skin: Negative for rash.  Neurological: Positive for headaches. Negative for dizziness and light-headedness.     Physical Exam Triage Vital Signs ED Triage Vitals  Enc Vitals Group     BP 02/19/20 0829  135/74     Pulse Rate 02/19/20 0829 (!) 101     Resp 02/19/20 0829 17     Temp 02/19/20 0829 98.8 F (37.1 C)     Temp Source 02/19/20 0829 Oral     SpO2 02/19/20 0829 100 %     Weight --      Height --      Head Circumference --      Peak Flow --      Pain Score 02/19/20 0828 6     Pain Loc --      Pain Edu? --      Excl. in GC? --    No data found.  Updated Vital Signs BP 135/74 (BP Location: Right Arm)   Pulse (!) 101  Temp 98.8 F (37.1 C) (Oral)   Resp 17   SpO2 100%   Visual Acuity Right Eye Distance:   Left Eye Distance:   Bilateral Distance:    Right Eye Near:   Left Eye Near:    Bilateral Near:     Physical Exam Vitals and nursing note reviewed.  Constitutional:      General: She is not in acute distress.    Appearance: She is well-developed.  HENT:     Head: Normocephalic and atraumatic.     Ears:     Comments: Bilateral ears without tenderness to palpation of external auricle, tragus and mastoid, EAC's without erythema or swelling, TM's with good bony landmarks and cone of light. Non erythematous.     Nose:     Comments: Nasal mucosa mildly erythematous, superior sinus turbinates swollen    Mouth/Throat:     Comments: Oral mucosa pink and moist, no tonsillar enlargement or exudate. Posterior pharynx patent and nonerythematous, no uvula deviation or swelling. Normal phonation.  Eyes:     Conjunctiva/sclera: Conjunctivae normal.  Cardiovascular:     Rate and Rhythm: Normal rate and regular rhythm.     Heart sounds: No murmur.  Pulmonary:     Effort: Pulmonary effort is normal. No respiratory distress.     Breath sounds: Normal breath sounds.     Comments: Breathing comfortably at rest, CTABL, no wheezing, rales or other adventitious sounds auscultated Occasional cough in room Abdominal:     Palpations: Abdomen is soft.     Tenderness: There is no abdominal tenderness.  Musculoskeletal:     Cervical back: Neck supple.  Skin:    General: Skin  is warm and dry.  Neurological:     Mental Status: She is alert.      UC Treatments / Results  Labs (all labs ordered are listed, but only abnormal results are displayed) Labs Reviewed  NOVEL CORONAVIRUS, NAA (HOSP ORDER, SEND-OUT TO REF LAB; TAT 18-24 HRS)    EKG   Radiology No results found.  Procedures Procedures (including critical care time)  Medications Ordered in UC Medications - No data to display  Initial Impression / Assessment and Plan / UC Course  I have reviewed the triage vital signs and the nursing notes.  Pertinent labs & imaging results that were available during my care of the patient were reviewed by me and considered in my medical decision making (see chart for details).     Covid PCR pending, treating for sinusitis with Augmentin twice daily x1 week.  Continue Claritin, adding Flonase.  Deferring oral steroids given history of DM.  Tessalon every 8 hours as needed for cough.  Rest and fluids.  Discussed strict return precautions. Patient verbalized understanding and is agreeable with plan.  Final Clinical Impressions(s) / UC Diagnoses   Final diagnoses:  Acute sinusitis with symptoms > 10 days     Discharge Instructions     COVID swab pending Begin Augmentin twice daily for 1 week Continue Claritin Flonase nasal spray 1-2 each nostril daily Tessalon every 8 hours for cough   ED Prescriptions    Medication Sig Dispense Auth. Provider   amoxicillin-clavulanate (AUGMENTIN) 875-125 MG tablet Take 1 tablet by mouth every 12 (twelve) hours for 7 days. 14 tablet Roque Schill C, PA-C   fluticasone (FLONASE) 50 MCG/ACT nasal spray Place 1-2 sprays into both nostrils daily for 7 days. 1 g Makenze Ellett C, PA-C   benzonatate (TESSALON) 200 MG capsule Take 1 capsule (  200 mg total) by mouth 3 (three) times daily as needed for up to 7 days for cough. 28 capsule Cyril Railey, [place] C, PA-C     PDMP not reviewed this encounter.   Correne Lalani, [place] C,  PA-C 02/19/20 1000

## 2020-02-19 NOTE — ED Triage Notes (Signed)
Pt presents with nasal drainage, congestion, productive cough, and headache X 1 week with no relief with OTC medication.

## 2020-02-21 LAB — NOVEL CORONAVIRUS, NAA (HOSP ORDER, SEND-OUT TO REF LAB; TAT 18-24 HRS): SARS-CoV-2, NAA: DETECTED — AB

## 2020-02-22 ENCOUNTER — Telehealth: Payer: Self-pay | Admitting: Pulmonary Disease

## 2020-02-22 ENCOUNTER — Telehealth (HOSPITAL_COMMUNITY): Payer: Self-pay | Admitting: Emergency Medicine

## 2020-02-22 NOTE — Telephone Encounter (Signed)
02/22/2020    I attempted to reach the patient regarding recent Covid diagnosis as well as opportunity for monoclonal antibody infusion.  It does look like the patient would qualify based off of Age and HTN history.  I have left a message with the telephone number: [phone] for the patient to call us back if they are interested.    Will route to PCP as FYI.  Coral Ceo, NP

## 2020-02-22 NOTE — Telephone Encounter (Signed)

## 2020-03-03 ENCOUNTER — Telehealth: Payer: Self-pay | Admitting: Pulmonary Disease

## 2020-03-03 NOTE — Telephone Encounter (Signed)
lmtcb for pt.  

## 2020-03-04 NOTE — Telephone Encounter (Signed)
ATC Patient.  LMTCB. 

## 2020-03-05 ENCOUNTER — Telehealth: Payer: Self-pay | Admitting: Pulmonary Disease

## 2020-03-05 NOTE — Telephone Encounter (Signed)
It looks as though Sara Juarez contacted the pt about possibly doing the COVID antibodies infusion. Pt is now past the 10 day period to be able to receive that treatment.

## 2020-03-05 NOTE — Telephone Encounter (Signed)
Called and spoke to pt. Informed her of the recs per Elisha Headland, NP. Gave her contact information for covid clinic, [phone]. Pt verbalized understanding and denied any further questions or concerns at this time.

## 2020-03-05 NOTE — Telephone Encounter (Signed)
Pt returned call. Pt states she was returning our call. Per pt's chart Elisha Headland, NP, tried to reach pt on 02/22/2020 regarding her covid diagnosis and the antibody infusion. However, pt is now past the infusion window. Pt states overall she does feel she improving but slowly. Pt only c/o fatigue and dizziness. Advised pt to rest and drink plenty of fluids. Pt aware to contact our office back if her s/s do not improve or worsen.   Will forward to Elisha Headland, NP, as Lorain Childes.

## 2020-03-05 NOTE — Telephone Encounter (Signed)
Sorry for any confusion I am glad the patient is doing well.  The patient does not establish with our office.I would make sure that she is aware that we have a Covid clinic at [facility].  Patient should be provided that information.  Patient will need to contact the Covid clinic or her primary care office for further follow-up.Elisha Headland, FNP

## 2021-05-06 ENCOUNTER — Encounter: Payer: Self-pay | Admitting: Neurology

## 2021-05-06 ENCOUNTER — Ambulatory Visit: Payer: Medicare Other | Admitting: Neurology

## 2021-07-21 ENCOUNTER — Ambulatory Visit: Payer: Medicare Other | Admitting: Neurology

## 2021-07-30 ENCOUNTER — Ambulatory Visit: Payer: Medicare Other | Admitting: Neurology

## 2021-11-28 ENCOUNTER — Other Ambulatory Visit: Payer: Self-pay

## 2021-11-28 ENCOUNTER — Encounter (HOSPITAL_COMMUNITY): Payer: Self-pay | Admitting: Emergency Medicine

## 2021-11-28 ENCOUNTER — Ambulatory Visit (INDEPENDENT_AMBULATORY_CARE_PROVIDER_SITE_OTHER): Payer: Medicare Other

## 2021-11-28 ENCOUNTER — Ambulatory Visit (HOSPITAL_COMMUNITY)
Admission: EM | Admit: 2021-11-28 | Discharge: 2021-11-28 | Disposition: A | Discharge status: Home / Self Care | Payer: Medicare Other | Attending: Emergency Medicine | Admitting: Emergency Medicine

## 2021-11-28 DIAGNOSIS — R059 Cough, unspecified: Secondary | ICD-10-CM | POA: Diagnosis not present

## 2021-11-28 DIAGNOSIS — J189 Pneumonia, unspecified organism: Secondary | ICD-10-CM | POA: Diagnosis not present

## 2021-11-28 MED ORDER — LEVOFLOXACIN 500 MG PO TABS: 500.0000 mg | ORAL_TABLET | Freq: Every day | ORAL | 0 refills | Status: DC

## 2021-11-28 MED ORDER — CYCLOBENZAPRINE HCL 10 MG PO TABS: 10.0000 mg | ORAL_TABLET | Freq: Two times a day (BID) | ORAL | 0 refills | Status: DC | PRN

## 2021-11-28 MED ORDER — PSEUDOEPH-BROMPHEN-DM 30-2-10 MG/5ML PO SYRP: 10.0000 mL | ORAL_SOLUTION | Freq: Four times a day (QID) | ORAL | 0 refills | Status: DC | PRN

## 2021-11-28 MED ORDER — BENZONATATE 100 MG PO CAPS: 100.0000 mg | ORAL_CAPSULE | Freq: Three times a day (TID) | ORAL | 0 refills | Status: DC

## 2021-11-28 MED ORDER — FLUTICASONE PROPIONATE 50 MCG/ACT NA SUSP: 1.0000 | Freq: Two times a day (BID) | NASAL | 0 refills | Status: DC

## 2021-11-28 NOTE — ED Triage Notes (Signed)
Pt reports having cough and congestion since Thanksgiving. Tried taking OTC meds for congestion. Home test for covid negative. Cough is productive.

## 2021-11-28 NOTE — ED Provider Notes (Signed)
[facility]  ____________________________________________  Time seen: Approximately 4:50 PM  I have reviewed the triage vital signs and the nursing notes.   HISTORY  Chief Complaint Cough and Nasal Congestion    HPI Sara Juarez is a 60 y.o. female who presents to the urgent care complaining of cough x2 weeks.  Patient states that she started off with some viral URI-like symptoms, thought she was improving and then has had a worsening cough.  Patient states that it is productive at this time.  No fevers or chills but she does have some nasal congestion.  No sore throat.  No difficulty breathing.  No chest pain.  No GI symptoms.       Past Medical History:  Diagnosis Date   Anxiety    Asthma    Cervical herniated disc    Diabetes mellitus without complication (HCC)    Glaucoma (increased eye pressure)    HTN (hypertension) 02/21/2017    Patient Active Problem List   Diagnosis Date Noted   E coli bacteremia 04/16/2017   Sepsis secondary to UTI (HCC) 04/15/2017   DM (diabetes mellitus), type 2, uncontrolled 04/15/2017   Chronic pain 04/15/2017   Constipation 04/15/2017   Pyelonephritis 02/21/2017   Vaginal discharge 02/21/2017   Sepsis (HCC) 02/21/2017   DM2 (diabetes mellitus, type 2) (HCC) 02/21/2017   HTN (hypertension) 02/21/2017    Past Surgical History:  Procedure Laterality Date   BILATERAL CARPAL TUNNEL RELEASE     BLADDER SUSPENSION     NASAL SINUS SURGERY     PARTIAL HYSTERECTOMY      Prior to Admission medications   Medication Sig Start Date End Date Taking? Authorizing Provider  benzonatate (TESSALON) 100 MG capsule Take 1 capsule (100 mg total) by mouth every 8 (eight) hours. 11/28/21  Yes Jerusalem Brownstein, Delorise Royals, PA-C  brompheniramine-pseudoephedrine-DM 30-2-10 MG/5ML syrup Take 10 mLs by mouth 4 (four) times daily as needed. 11/28/21  Yes Dyani Babel, Delorise Royals, PA-C  cyclobenzaprine (FLEXERIL) 10 MG tablet Take 1 tablet (10 mg  total) by mouth 2 (two) times daily as needed for muscle spasms. 11/28/21  Yes Oona Trammel, Delorise Royals, PA-C  fluticasone (FLONASE) 50 MCG/ACT nasal spray Place 1 spray into both nostrils 2 (two) times daily. 11/28/21  Yes Taina Landry, Delorise Royals, PA-C  levofloxacin (LEVAQUIN) 500 MG tablet Take 1 tablet (500 mg total) by mouth daily. 11/28/21  Yes Harjot Dibello, Delorise Royals, PA-C  ALPRAZolam [name]) 1 MG tablet Take 1 mg by mouth 2 (two) times daily as needed for anxiety.  08/07/16   [provider]  cetirizine (ZYRTEC) 10 MG tablet Take 10 mg by mouth daily.    [provider]  HYDROcodone-acetaminophen (NORCO) 10-325 MG tablet Take 1 tablet by mouth every 6 (six) hours as needed for pain. 07/08/16   [provider]  insulin lispro protamine-lispro (HUMALOG 75/25 MIX) (75-25) 100 UNIT/ML SUSP injection Inject 10 Units into the skin See admin instructions. Uses only when sugar goes over 150.    [provider]  Insulin Pen Needle 31G X 5 MM MISC 1 Units by Does not apply route as directed. 04/17/17   Johnson, Clanford L, MD  lisinopril (PRINIVIL,ZESTRIL) 20 MG tablet Take 20 mg by mouth daily.    [provider]  LUMIGAN 0.01 % SOLN Place 1 drop into both eyes every evening. 06/04/16   [provider]  Olopatadine HCl (PAZEO) 0.7 % SOLN Apply 1 drop to eye daily. Patient taking differently: Place 1 drop  into both eyes daily.  03/30/19   Rennis Chris, MD  omeprazole (PRILOSEC) 20 MG capsule Take 20 mg by mouth 3 (three) times daily.  07/08/16   [provider]  PROAIR HFA 108 (90 Base) MCG/ACT inhaler Inhale 1 puff into the lungs every 6 (six) hours as needed for wheezing. 06/08/16   [provider]  triamcinolone cream (KENALOG) 0.1 % Apply 1 application topically daily as needed (on affected areas(s) on skin).  01/14/17   [provider]  Vitamin D, Ergocalciferol, (DRISDOL) 50000 units CAPS capsule Take 50,000 Units by mouth every  Tuesday. 02/09/17   [provider]  insulin aspart protamine - aspart (NOVOLOG 70/30 MIX) (70-30) 100 UNIT/ML FlexPen Take 35 units with breakfast and take 30 units with supper everyday. Patient not taking: Reported on 05/18/2017 04/17/17 02/19/20  Cleora Fleet, MD  metFORMIN (GLUCOPHAGE XR) 500 MG 24 hr tablet Take 2 tabs with breakfast and 2 tabs with supper everyday Patient not taking: Reported on 04/10/2019 04/17/17 02/19/20  Cleora Fleet, MD    Allergies Erythromycin and Aspirin  Family History  Problem Relation Age of Onset   Hypertension Mother    Diabetes Mother    Hypertension Father    Diabetes Father    Colon cancer Father     Social History Social History   Tobacco Use   Smoking status: Every Day    Packs/day: 0.75    Types: Cigarettes   Smokeless tobacco: Never  Vaping Use   Vaping Use: Never used  Substance Use Topics   Alcohol use: Yes    Alcohol/week: 2.0 standard drinks    Types: 2 Shots of liquor per week    Comment: shots    Drug use: Yes    Types: Cocaine    Comment: tried crack 2 days ago      Review of Systems  Constitutional: No fever/chills Eyes: No visual changes. No discharge ENT: No upper respiratory complaints. Cardiovascular: no chest pain. Respiratory: Productive cough. No SOB. Gastrointestinal: No abdominal pain.  No nausea, no vomiting.  No diarrhea.  No constipation. Musculoskeletal: Negative for musculoskeletal pain. Skin: Negative for rash, abrasions, lacerations, ecchymosis. Neurological: Negative for headaches, focal weakness or numbness.  10 System ROS otherwise negative.  ____________________________________________   PHYSICAL EXAM:  VITAL SIGNS: ED Triage Vitals  Enc Vitals Group     BP 11/28/21 1630 136/85     Pulse Rate 11/28/21 1630 95     Resp 11/28/21 1630 17     Temp 11/28/21 1630 98.4 F (36.9 C)     Temp Source 11/28/21 1630 Oral     SpO2 11/28/21 1630 92 %     Weight --      Height --       Head Circumference --      Peak Flow --      Pain Score 11/28/21 1629 10     Pain Loc --      Pain Edu? --      Excl. in GC? --      Constitutional: Alert and oriented. Well appearing and in no acute distress. Eyes: Conjunctivae are normal. PERRL. EOMI. Head: Atraumatic. ENT:      Ears:       Nose: No congestion/rhinnorhea.      Mouth/Throat: Mucous membranes are moist.  Neck: No stridor.  Hematological/Lymphatic/Immunilogical: No cervical lymphadenopathy. Cardiovascular: Normal rate, regular rhythm. Normal S1 and S2.  Good peripheral circulation. Respiratory: Normal respiratory effort without tachypnea or  retractions. Lungs with rales and crackles.Peri [place] air entry to the bases with no decreased or absent breath sounds. Gastrointestinal: Bowel sounds 4 quadrants. Soft and nontender to palpation. No guarding or rigidity. No palpable masses. No distention. No CVA tenderness Musculoskeletal: Full range of motion to all extremities. No gross deformities appreciated. Neurologic:  Normal speech and language. No gross focal neurologic deficits are appreciated.  Skin:  Skin is warm, dry and intact. No rash noted. Psychiatric: Mood and affect are normal. Speech and behavior are normal. Patient exhibits appropriate insight and judgement.   ____________________________________________   LABS (all labs ordered are listed, but only abnormal results are displayed)  Labs Reviewed - No data to display ____________________________________________  EKG   ____________________________________________  RADIOLOGY I personally viewed and evaluated these images as part of my medical decision making, as well as reviewing the written report by the radiologist.  ED Provider Interpretation: No frank consolidations  DG Chest 2 View  Result Date: 11/28/2021 CLINICAL DATA:  Productive cough and congestion for 2 weeks, tobacco abuse EXAM: CHEST - 2 VIEW COMPARISON:  01/26/2021 FINDINGS: Frontal  and lateral views of the chest demonstrate an unremarkable cardiac silhouette. No airspace disease, effusion, or pneumothorax. Chronic interstitial prominence consistent with tobacco abuse. No acute bony abnormalities. IMPRESSION: 1. No acute intrathoracic process. Electronically Signed   By: Sharlet Salina M.D.   On: 11/28/2021 17:07    ____________________________________________    PROCEDURES  Procedure(s) performed:    Procedures    Medications - No data to display   ____________________________________________   INITIAL IMPRESSION / ASSESSMENT AND PLAN / ED COURSE  Pertinent labs & imaging results that were available during my care of the patient were reviewed by me and considered in my medical decision making (see chart for details).  Review of the [place] CSRS was performed in accordance of the [facility] prior to dispensing any controlled drugs.           Patient's diagnosis is consistent with [facility] pneumonia.  Patient has had 2 weeks of cough, nasal congestion.  Physical exam was concerning for pneumonia.  No significant consolidation on x-ray but clinical presentation suggest community-acquired pneumonia.  Patient will be placed on antibiotics, cough medication, Flonase.  She will be placed on Levaquin as she is allergic to erythromycin.  At this time follow-up primary care as needed.  Return precautions discussed with the patient.  ..  patient is given ED precautions to return to the ED for any worsening or new symptoms.     ____________________________________________  FINAL CLINICAL IMPRESSION(S) / DIAGNOSES  Final diagnoses:  Community acquired pneumonia, unspecified laterality      NEW MEDICATIONS STARTED DURING THIS VISIT:  ED Discharge Orders          Ordered    brompheniramine-pseudoephedrine-DM 30-2-10 MG/5ML syrup  4 times daily PRN        11/28/21 1735    benzonatate (TESSALON) 100 MG capsule  Every 8 hours        11/28/21 1735    fluticasone  (FLONASE) 50 MCG/ACT nasal spray  2 times daily        11/28/21 1735    cyclobenzaprine (FLEXERIL) 10 MG tablet  2 times daily PRN        11/28/21 1735    levofloxacin (LEVAQUIN) 500 MG tablet  Daily        11/28/21 1735                This chart was dictated  using voice recognition software/Dragon. Despite best efforts to proofread, errors can occur which can change the meaning. Any change was purely unintentional.    Racheal Patches, PA-C 11/28/21 1737

## 2022-01-04 ENCOUNTER — Encounter (HOSPITAL_COMMUNITY): Payer: Self-pay | Admitting: Emergency Medicine

## 2022-01-04 ENCOUNTER — Other Ambulatory Visit: Payer: Self-pay

## 2022-01-04 ENCOUNTER — Ambulatory Visit (HOSPITAL_COMMUNITY)
Admission: EM | Admit: 2022-01-04 | Discharge: 2022-01-04 | Disposition: A | Discharge status: Home / Self Care | Payer: Medicare Other | Attending: Internal Medicine | Admitting: Internal Medicine

## 2022-01-04 DIAGNOSIS — J069 Acute upper respiratory infection, unspecified: Secondary | ICD-10-CM | POA: Diagnosis present

## 2022-01-04 DIAGNOSIS — Z20822 Contact with and (suspected) exposure to covid-19: Secondary | ICD-10-CM | POA: Insufficient documentation

## 2022-01-04 LAB — POC INFLUENZA A AND B ANTIGEN (URGENT CARE ONLY)
INFLUENZA A ANTIGEN, POC: NEGATIVE
INFLUENZA B ANTIGEN, POC: NEGATIVE

## 2022-01-04 MED ORDER — ACETAMINOPHEN 325 MG PO TABS
650.0000 mg | ORAL_TABLET | Freq: Once | ORAL | Status: AC
  Administered 2022-01-04: 650 mg via ORAL

## 2022-01-04 MED ORDER — ACETAMINOPHEN 325 MG PO TABS
ORAL_TABLET | ORAL | Status: AC
  Filled 2022-01-04: qty 2

## 2022-01-04 MED ORDER — HYDROXYZINE HCL 50 MG PO TABS: 50.0000 mg | ORAL_TABLET | Freq: Three times a day (TID) | ORAL | 0 refills | Status: DC | PRN

## 2022-01-04 NOTE — ED Triage Notes (Signed)
Sore throat around 2 am last night.   Lower back pain started hurting at the same time.  Pain in back radiating into both legs.

## 2022-01-04 NOTE — Discharge Instructions (Addendum)
Increase oral fluid intake Your flu test was negative. We will call you with recommendations if labs are abnormal Please quarantine until COVID-19 lab results are available If COVID-19 test is positive, we will send you antiviral medication This take medications as prescribed.

## 2022-01-04 NOTE — ED Provider Notes (Signed)
[facility]    CSN: [id number] Arrival date & time: 01/04/22  1326      History   Chief Complaint Chief Complaint  Patient presents with   Sore Throat    HPI Sara Juarez is a 61 y.o. female comes to the urgent care with 1 day history of generalized body aches, headaches, fever, chills, sore throat and cough.  Patien is not vaccinated against COVID-19 virus.  She denies any nausea, vomiting or diarrhea.  Pain is mainly in the lower back, aggravated by movement and denies any relieving factors.  No radiation of pain.  Patient has not tried any over-the-counter medications.  Patient's temperature was 100.2 Fahrenheit on presentation in the urgent care HPI  Past Medical History:  Diagnosis Date   Anxiety    Asthma    Cervical herniated disc    Diabetes mellitus without complication (HCC)    Glaucoma (increased eye pressure)    HTN (hypertension) 02/21/2017    Patient Active Problem List   Diagnosis Date Noted   E coli bacteremia 04/16/2017   Sepsis secondary to UTI (HCC) 04/15/2017   DM (diabetes mellitus), type 2, uncontrolled 04/15/2017   Chronic pain 04/15/2017   Constipation 04/15/2017   Pyelonephritis 02/21/2017   Vaginal discharge 02/21/2017   Sepsis (HCC) 02/21/2017   DM2 (diabetes mellitus, type 2) (HCC) 02/21/2017   HTN (hypertension) 02/21/2017    Past Surgical History:  Procedure Laterality Date   BILATERAL CARPAL TUNNEL RELEASE     BLADDER SUSPENSION     NASAL SINUS SURGERY     PARTIAL HYSTERECTOMY      OB History   No obstetric history on file.      Home Medications    Prior to Admission medications   Medication Sig Start Date End Date Taking? Authorizing Provider  Empagliflozin (JARDIANCE PO) Take by mouth.   Yes [provider]  hydrOXYzine (ATARAX) 50 MG tablet Take 1 tablet (50 mg total) by mouth every 8 (eight) hours as needed. 01/04/22  Yes Saloni Lablanc, Britta Mccreedy, MD  cetirizine (ZYRTEC) 10 MG tablet Take 10 mg by  mouth daily.    [provider]  fluticasone (FLONASE) 50 MCG/ACT nasal spray Place 1 spray into both nostrils 2 (two) times daily. 11/28/21   Cuthriell, Delorise Royals, PA-C  Insulin Pen Needle 31G X 5 MM MISC 1 Units by Does not apply route as directed. 04/17/17   Johnson, Clanford L, MD  lisinopril (PRINIVIL,ZESTRIL) 20 MG tablet Take 20 mg by mouth daily.    [provider]  LUMIGAN 0.01 % SOLN Place 1 drop into both eyes every evening. 06/04/16   [provider]  Olopatadine HCl (PAZEO) 0.7 % SOLN Apply 1 drop to eye daily. Patient taking differently: Place 1 drop into both eyes daily.  03/30/19   Rennis Chris, MD  omeprazole (PRILOSEC) 20 MG capsule Take 20 mg by mouth 3 (three) times daily.  07/08/16   [provider]  triamcinolone cream (KENALOG) 0.1 % Apply 1 application topically daily as needed (on affected areas(s) on skin).  01/14/17   [provider]  Vitamin D, Ergocalciferol, (DRISDOL) 50000 units CAPS capsule Take 50,000 Units by mouth every Tuesday. 02/09/17   [provider]  insulin aspart protamine - aspart (NOVOLOG 70/30 MIX) (70-30) 100 UNIT/ML FlexPen Take 35 units with breakfast and take 30 units with supper everyday. Patient not taking: Reported on 05/18/2017 04/17/17 02/19/20  Cleora Fleet, MD  metFORMIN (GLUCOPHAGE XR) 500 MG  24 hr tablet Take 2 tabs with breakfast and 2 tabs with supper everyday Patient not taking: Reported on 04/10/2019 04/17/17 02/19/20  Cleora FleetJohnson, Clanford L, MD    Family History Family History  Problem Relation Age of Onset   Hypertension Mother    Diabetes Mother    Hypertension Father    Diabetes Father    Colon cancer Father     Social History Social History   Tobacco Use   Smoking status: Former    Packs/day: 0.75    Types: Cigarettes   Smokeless tobacco: Never  Vaping Use   Vaping Use: Never used  Substance Use Topics   Alcohol use: Yes    Alcohol/week: 2.0 standard drinks     Types: 2 Shots of liquor per week    Comment: shots    Drug use: Yes    Types: Cocaine     Allergies   Erythromycin and Aspirin   Review of Systems Review of Systems  Constitutional:  Positive for chills and fever. Negative for fatigue.  HENT:  Positive for congestion and sore throat.   Respiratory:  Positive for cough.   Gastrointestinal: Negative.   Genitourinary: Negative.   Musculoskeletal:  Positive for arthralgias and back pain.  Neurological:  Positive for headaches. Negative for dizziness and light-headedness.    Physical Exam Triage Vital Signs ED Triage Vitals  Enc Vitals Group     BP 01/04/22 1418 (!) 163/91     Pulse Rate 01/04/22 1418 94     Resp 01/04/22 1418 (!) 22     Temp 01/04/22 1418 100.2 F (37.9 C)     Temp Source 01/04/22 1418 Oral     SpO2 01/04/22 1418 99 %     Weight --      Height --      Head Circumference --      Peak Flow --      Pain Score 01/04/22 1413 10     Pain Loc --      Pain Edu? --      Excl. in GC? --    No data found.  Updated Vital Signs BP (!) 163/91 (BP Location: Left Arm) Comment (BP Location): large cuff   Pulse 94    Temp 100.2 F (37.9 C) (Oral)    Resp (!) 22    SpO2 99%   Visual Acuity Right Eye Distance:   Left Eye Distance:   Bilateral Distance:    Right Eye Near:   Left Eye Near:    Bilateral Near:     Physical Exam Vitals and nursing note reviewed.  Constitutional:      General: She is in acute distress.     Appearance: She is ill-appearing.  HENT:     Right Ear: Tympanic membrane normal.     Left Ear: Tympanic membrane normal.  Eyes:     Conjunctiva/sclera: Conjunctivae normal.  Cardiovascular:     Rate and Rhythm: Normal rate and regular rhythm.     Heart sounds: Normal heart sounds.  Pulmonary:     Effort: Pulmonary effort is normal.     Breath sounds: Normal breath sounds.  Abdominal:     General: Bowel sounds are normal.     Palpations: Abdomen is soft.  Musculoskeletal:      Cervical back: Normal range of motion.  Neurological:     Mental Status: She is alert.     UC Treatments / Results  Labs (all labs ordered are listed, but only abnormal  results are displayed) Labs Reviewed  SARS CORONAVIRUS 2 (TAT 6-24 HRS)  POC INFLUENZA A AND B ANTIGEN (URGENT CARE ONLY)    EKG   Radiology No results found.  Procedures Procedures (including critical care time)  Medications Ordered in UC Medications  acetaminophen (TYLENOL) tablet 650 mg (650 mg Oral Given 01/04/22 1517)    Initial Impression / Assessment and Plan / UC Course  I have reviewed the triage vital signs and the nursing notes.  Pertinent labs & imaging results that were available during my care of the patient were reviewed by me and considered in my medical decision making (see chart for details).     1.  Viral respiratory illness: Flu test is negative COVID-19 PCR test has been sent Increase oral fluid intake Extra strength Tylenol as needed for pain We will call you with recommendations if labs are abnormal Return precautions given Final Clinical Impressions(s) / UC Diagnoses   Final diagnoses:  Viral upper respiratory illness     Discharge Instructions      Increase oral fluid intake Your flu test was negative. We will call you with recommendations if labs are abnormal Please quarantine until COVID-19 lab results are available If COVID-19 test is positive, we will send you antiviral medication This take medications as prescribed.    ED Prescriptions     Medication Sig Dispense Auth. Provider   hydrOXYzine (ATARAX) 50 MG tablet Take 1 tablet (50 mg total) by mouth every 8 (eight) hours as needed. 30 tablet Rex Magee, Britta Mccreedy, MD      PDMP not reviewed this encounter.   Merrilee Jansky, MD 01/04/22 [phone]

## 2022-01-04 NOTE — ED Notes (Signed)
At discharge, patient demanded to speak to provider.  Feels tylenol is not going to help

## 2022-01-04 NOTE — ED Notes (Signed)
Covid swab in lab

## 2022-01-05 LAB — SARS CORONAVIRUS 2 (TAT 6-24 HRS): SARS Coronavirus 2: NEGATIVE

## 2022-05-28 ENCOUNTER — Other Ambulatory Visit: Payer: Self-pay | Admitting: Student

## 2022-05-28 DIAGNOSIS — F17200 Nicotine dependence, unspecified, uncomplicated: Secondary | ICD-10-CM

## 2022-06-23 ENCOUNTER — Inpatient Hospital Stay: Admission: RE | Admit: 2022-06-23 | Payer: Medicare Other | Source: Ambulatory Visit

## 2022-06-28 ENCOUNTER — Ambulatory Visit
Admission: RE | Admit: 2022-06-28 | Discharge: 2022-06-28 | Disposition: A | Discharge status: Home / Self Care | Payer: Medicare Other | Source: Ambulatory Visit | Attending: Student | Admitting: Student

## 2022-06-28 DIAGNOSIS — F17200 Nicotine dependence, unspecified, uncomplicated: Secondary | ICD-10-CM

## 2023-04-29 ENCOUNTER — Other Ambulatory Visit: Payer: Self-pay | Admitting: Student

## 2023-04-29 DIAGNOSIS — F17210 Nicotine dependence, cigarettes, uncomplicated: Secondary | ICD-10-CM

## 2023-08-26 ENCOUNTER — Encounter (HOSPITAL_COMMUNITY): Payer: Self-pay

## 2023-08-26 ENCOUNTER — Ambulatory Visit (HOSPITAL_COMMUNITY)
Admission: EM | Admit: 2023-08-26 | Discharge: 2023-08-26 | Disposition: A | Discharge status: Home / Self Care | Payer: No Typology Code available for payment source | Source: Ambulatory Visit | Attending: Emergency Medicine | Admitting: Emergency Medicine

## 2023-08-26 ENCOUNTER — Emergency Department (HOSPITAL_COMMUNITY)
Admission: EM | Admit: 2023-08-26 | Discharge: 2023-08-26 | Disposition: A | Discharge status: Home / Self Care | Payer: Medicare HMO | Attending: Emergency Medicine | Admitting: Emergency Medicine

## 2023-08-26 ENCOUNTER — Other Ambulatory Visit: Payer: Self-pay

## 2023-08-26 DIAGNOSIS — S8002XA Contusion of left knee, initial encounter: Secondary | ICD-10-CM | POA: Diagnosis not present

## 2023-08-26 DIAGNOSIS — E119 Type 2 diabetes mellitus without complications: Secondary | ICD-10-CM | POA: Diagnosis not present

## 2023-08-26 DIAGNOSIS — Z0441 Encounter for examination and observation following alleged adult rape: Secondary | ICD-10-CM | POA: Diagnosis present

## 2023-08-26 DIAGNOSIS — Z7984 Long term (current) use of oral hypoglycemic drugs: Secondary | ICD-10-CM | POA: Diagnosis not present

## 2023-08-26 DIAGNOSIS — Z79899 Other long term (current) drug therapy: Secondary | ICD-10-CM | POA: Diagnosis not present

## 2023-08-26 DIAGNOSIS — I10 Essential (primary) hypertension: Secondary | ICD-10-CM | POA: Diagnosis not present

## 2023-08-26 DIAGNOSIS — X58XXXA Exposure to other specified factors, initial encounter: Secondary | ICD-10-CM | POA: Diagnosis not present

## 2023-08-26 DIAGNOSIS — S8001XA Contusion of right knee, initial encounter: Secondary | ICD-10-CM | POA: Diagnosis not present

## 2023-08-26 DIAGNOSIS — T7421XA Adult sexual abuse, confirmed, initial encounter: Secondary | ICD-10-CM | POA: Diagnosis not present

## 2023-08-26 DIAGNOSIS — Z794 Long term (current) use of insulin: Secondary | ICD-10-CM | POA: Insufficient documentation

## 2023-08-26 DIAGNOSIS — S8000XA Contusion of unspecified knee, initial encounter: Secondary | ICD-10-CM

## 2023-08-26 LAB — HIV ANTIBODY (ROUTINE TESTING W REFLEX): HIV Screen 4th Generation wRfx: NONREACTIVE

## 2023-08-26 MED ORDER — CEFTRIAXONE SODIUM 500 MG IJ SOLR
500.0000 mg | Freq: Once | INTRAMUSCULAR | Status: AC
  Administered 2023-08-26: 500 mg via INTRAMUSCULAR

## 2023-08-26 MED ORDER — LIDOCAINE HCL (PF) 1 % IJ SOLN
1.0000 mL | Freq: Once | INTRAMUSCULAR | Status: AC
  Administered 2023-08-26: 1 mL

## 2023-08-26 MED ORDER — AZITHROMYCIN 250 MG PO TABS
1000.0000 mg | ORAL_TABLET | Freq: Once | ORAL | Status: AC
  Administered 2023-08-26: 1000 mg via ORAL

## 2023-08-26 MED ORDER — ONDANSETRON 4 MG PO TBDP
8.0000 mg | ORAL_TABLET | Freq: Once | ORAL | Status: AC
  Administered 2023-08-26: 8 mg via ORAL

## 2023-08-26 MED ORDER — DOXYCYCLINE HYCLATE 100 MG PO TABS
100.0000 mg | ORAL_TABLET | Freq: Two times a day (BID) | ORAL | 0 refills | Status: DC
Start: 2023-08-26 — End: 2023-08-27

## 2023-08-26 MED ORDER — METRONIDAZOLE 500 MG PO TABS
2000.0000 mg | ORAL_TABLET | Freq: Once | ORAL | Status: AC
  Administered 2023-08-26: 2000 mg via ORAL
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
## 2023-08-27 LAB — RPR: RPR Ser Ql: NONREACTIVE

## 2023-08-27 NOTE — SANE Note (Signed)
The SANE/FNE Teacher, music) consult has been completed. The ED Provider, Cheron Schaumann, PA, recommended the patient be discharged after the FNE exam as the pt had been medically cleared prior to the FNE Exam, unless there were additional findings needing evaluation by the ED provider. After the Exam, the pt was escorted by this RN back to the ED Lobby where she was discharged ambulatory, VSS, A & O x 3, with written instructions and all of the information documented in the DC AVS.   Please contact the SANE/FNE nurse on call (listed in Amion) with any further concerns.

## 2023-09-06 ENCOUNTER — Emergency Department (HOSPITAL_COMMUNITY)
Admission: EM | Admit: 2023-09-06 | Discharge: 2023-09-06 | Discharge status: Against Medical Advice | Payer: Medicare HMO

## 2023-09-06 NOTE — ED Triage Notes (Signed)
Registration advised RN that pt. Left .

## 2023-09-19 DIAGNOSIS — J0111 Acute recurrent frontal sinusitis: Secondary | ICD-10-CM | POA: Diagnosis not present

## 2023-09-19 DIAGNOSIS — Z5181 Encounter for therapeutic drug level monitoring: Secondary | ICD-10-CM | POA: Diagnosis not present

## 2023-09-19 DIAGNOSIS — Z7722 Contact with and (suspected) exposure to environmental tobacco smoke (acute) (chronic): Secondary | ICD-10-CM | POA: Diagnosis not present

## 2023-09-19 DIAGNOSIS — C9590 Leukemia, unspecified not having achieved remission: Secondary | ICD-10-CM | POA: Diagnosis not present

## 2023-09-19 DIAGNOSIS — F1920 Other psychoactive substance dependence, uncomplicated: Secondary | ICD-10-CM | POA: Diagnosis not present

## 2023-09-22 ENCOUNTER — Ambulatory Visit (HOSPITAL_COMMUNITY)
Admission: EM | Admit: 2023-09-22 | Discharge: 2023-09-22 | Disposition: A | Discharge status: Other Acute Inpatient Hospital | Payer: 59 | Attending: Psychiatry | Admitting: Psychiatry

## 2023-09-22 ENCOUNTER — Telehealth (HOSPITAL_COMMUNITY): Payer: Self-pay | Admitting: Licensed Clinical Social Worker

## 2023-09-22 ENCOUNTER — Other Ambulatory Visit (HOSPITAL_COMMUNITY)
Admission: EM | Admit: 2023-09-22 | Discharge: 2023-09-22 | Disposition: A | Discharge status: Home / Self Care | Payer: 59 | Attending: Psychiatry | Admitting: Psychiatry

## 2023-09-22 DIAGNOSIS — Z560 Unemployment, unspecified: Secondary | ICD-10-CM | POA: Diagnosis not present

## 2023-09-22 DIAGNOSIS — F1010 Alcohol abuse, uncomplicated: Secondary | ICD-10-CM | POA: Diagnosis not present

## 2023-09-22 DIAGNOSIS — F1110 Opioid abuse, uncomplicated: Secondary | ICD-10-CM | POA: Diagnosis not present

## 2023-09-22 DIAGNOSIS — F1410 Cocaine abuse, uncomplicated: Secondary | ICD-10-CM | POA: Insufficient documentation

## 2023-09-22 DIAGNOSIS — Z5901 Sheltered homelessness: Secondary | ICD-10-CM | POA: Insufficient documentation

## 2023-09-22 DIAGNOSIS — Z91410 Personal history of adult physical and sexual abuse: Secondary | ICD-10-CM | POA: Insufficient documentation

## 2023-09-22 DIAGNOSIS — Z79899 Other long term (current) drug therapy: Secondary | ICD-10-CM | POA: Diagnosis present

## 2023-09-22 DIAGNOSIS — F331 Major depressive disorder, recurrent, moderate: Secondary | ICD-10-CM | POA: Insufficient documentation

## 2023-09-22 DIAGNOSIS — F4310 Post-traumatic stress disorder, unspecified: Secondary | ICD-10-CM | POA: Diagnosis not present

## 2023-09-22 DIAGNOSIS — F1910 Other psychoactive substance abuse, uncomplicated: Secondary | ICD-10-CM | POA: Diagnosis present

## 2023-09-22 DIAGNOSIS — F1420 Cocaine dependence, uncomplicated: Secondary | ICD-10-CM | POA: Diagnosis present

## 2023-09-22 LAB — COMPREHENSIVE METABOLIC PANEL
ALT: 17 U/L (ref 0–44)
AST: 15 U/L (ref 15–41)
Albumin: 3.7 g/dL (ref 3.5–5.0)
Alkaline Phosphatase: 55 U/L (ref 38–126)
Anion gap: 14 (ref 5–15)
BUN: 11 mg/dL (ref 8–23)
CO2: 27 mmol/L (ref 22–32)
Calcium: 9.8 mg/dL (ref 8.9–10.3)
Chloride: 100 mmol/L (ref 98–111)
Creatinine, Ser: 0.7 mg/dL (ref 0.44–1.00)
GFR, Estimated: >60 mL/min (ref >60)
Glucose, Bld: 171 mg/dL — ABNORMAL HIGH (ref 70–99)
Potassium: 4 mmol/L (ref 3.5–5.1)
Sodium: 141 mmol/L (ref 135–145)
Total Bilirubin: 0.7 mg/dL (ref 0.3–1.2)
Total Protein: 7 g/dL (ref 6.5–8.1)

## 2023-09-22 LAB — CBC WITH DIFFERENTIAL/PLATELET
Abs Immature Granulocytes: 0.01 10*3/uL (ref 0.00–0.07)
Basophils Absolute: 0 10*3/uL (ref 0.0–0.1)
Basophils Relative: 1 %
Eosinophils Absolute: 0.2 10*3/uL (ref 0.0–0.5)
Eosinophils Relative: 3 %
HCT: 47.9 % — ABNORMAL HIGH (ref 36.0–46.0)
Hemoglobin: 15.2 g/dL — ABNORMAL HIGH (ref 12.0–15.0)
Immature Granulocytes: 0 %
Lymphocytes Relative: 36 %
Lymphs Abs: 2.1 10*3/uL (ref 0.7–4.0)
MCH: 27.5 pg (ref 26.0–34.0)
MCHC: 31.7 g/dL (ref 30.0–36.0)
MCV: 86.8 fL (ref 80.0–100.0)
Monocytes Absolute: 0.5 10*3/uL (ref 0.1–1.0)
Monocytes Relative: 8 %
Neutro Abs: 3.1 10*3/uL (ref 1.7–7.7)
Neutrophils Relative %: 52 %
Platelets: 354 10*3/uL (ref 150–400)
RBC: 5.52 MIL/uL — ABNORMAL HIGH (ref 3.87–5.11)
RDW: 14.4 % (ref 11.5–15.5)
WBC: 5.9 10*3/uL (ref 4.0–10.5)
nRBC: 0 % (ref 0.0–0.2)

## 2023-09-22 LAB — TSH: TSH: 2.202 u[IU]/mL (ref 0.350–4.500)

## 2023-09-22 MED ORDER — ALUM & MAG HYDROXIDE-SIMETH 200-200-20 MG/5ML PO SUSP: 30.0000 mL | ORAL | Status: DC | PRN

## 2023-09-22 MED ORDER — MAGNESIUM HYDROXIDE 400 MG/5ML PO SUSP: 30.0000 mL | Freq: Every day | ORAL | Status: DC | PRN

## 2023-09-22 MED ORDER — ACETAMINOPHEN 325 MG PO TABS: 650.0000 mg | ORAL_TABLET | Freq: Four times a day (QID) | ORAL | Status: DC | PRN
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
## 2023-09-22 NOTE — Telephone Encounter (Signed)
The therapist returns Sara Juarez's call confirming her identity via two identifiers. She says that [facility] informed her that she needs to go to detox. Thus this therapist explains to her how to access inpatient detox services at the [facility].  Myrna Blazer, MA, LCSW, [facility], LCAS 09/22/2023

## 2023-09-22 NOTE — ED Provider Notes (Signed)
Facility Based Crisis Admission H&P  Date: 09/22/23 Patient Name: Sara Juarez MRN: [id number] Chief Complaint: been using drug and alcohol for 1 year (relapsed0  Diagnoses:  Final diagnoses:  Alcohol abuse  Opioid abuse (HCC)  Cocaine abuse (HCC)    HPI: Bobbiejo Kerrigan, 62 y/o female with a history of PTSD, bipolar disorder (currently not taking any medications), alcohol abuse cocaine abuse and opioid abuse presented to [facility] voluntarily.  Per the patient she is seeking detox from alcohol, cocaine, and opioid abuse.  Patient reports she is currently homeless however she was staying with her daughter.  Patient report that she tried detox 12 to 20 years ago but relapsed like a year ago because of abuse and she has been using ever since on and off for 1 year.  According to the patient she took herself off all drugs and alcohol 2 weeks ago.  Patient reports she is not currently seeing a psychiatrist or therapist and is currently not taking any medication for her bipolar disorder or her PTSD.  Patient reports she is unemployed.  Face-to-face observation of patient, patient is alert and oriented x 4, speech is clear, maintaining eye contact.  Patient does appear to be anxious, patient is fairly groomed.  Patient answers questions appropriately.  Patient denies SI, HI, AVH or paranoia.  Patient reports she drinks alcohol on a daily basis but stopped herself 2 weeks ago she was drinking 5 to 6 pints of hard liquor.  Patient also reports she used cocaine, she also used pain medications but is not prescribed, and marijuana usage.  According to patient she is currently not taking any prescribed psychiatric medications.  Patient does not seem to be withdrawal symptoms from alcohol.  Patient denies access to guns or any threat to herself or others.  Patient does not seem to be influenced by external/internal stimuli at this time.  Discussed with patient the admission process to [facility].  Patient in  agreement with plan of care.  PHQ9 completed pt scored 16 (moderate severe depression)  Recommend inpatient FBC.  PHQ 2-9:  Flowsheet Row ED from 09/22/2023 in [facility]  Thoughts that you would be better off dead, or of hurting yourself in some way Nearly every day  PHQ-9 Total Score 16       Flowsheet Row ED from 09/22/2023 in [facility] ED from 01/04/2022 in [facility] ED from 11/28/2021 in [facility] RISK CATEGORY Error: Q3, 4, or 5 should not be populated when Q2 is No No Risk No Risk         Total Time spent with patient: 20 minutes  Musculoskeletal  Strength & Muscle Tone: within normal limits Gait & Station: normal Patient leans: N/A  Psychiatric Specialty Exam  Presentation General Appearance:  Casual  Eye Contact: Good  Speech: Clear and Coherent  Speech Volume: Normal  Handedness: Right   Mood and Affect  Mood: Anxious  Affect: Appropriate   Thought Process  Thought Processes: Coherent  Descriptions of Associations:Circumstantial  Orientation:Full (Time, Place and Person)  Thought Content:Logical    Hallucinations:Hallucinations: None  Ideas of Reference:None  Suicidal Thoughts:Suicidal Thoughts: No  Homicidal Thoughts:Homicidal Thoughts: No   Sensorium  Memory: Immediate Fair  Judgment: Poor  Insight: Fair   Art therapist  Concentration: Fair  Attention Span: Good  Recall: Good  Fund of Knowledge: Good  Language: Good   Psychomotor Activity  Psychomotor  Activity: Psychomotor Activity: Normal   Assets  Assets: Desire for Improvement   Sleep  Sleep: Sleep: Fair Number of Hours of Sleep: 6   Nutritional Assessment (For OBS and FBC admissions only) Has the patient had a weight loss or gain of 10 pounds or more in the last 3 months?: No Has the patient had a decrease in  food intake/or appetite?: No Does the patient have dental problems?: No Does the patient have eating habits or behaviors that may be indicators of an eating disorder including binging or inducing vomiting?: No Has the patient recently lost weight without trying?: 0 Has the patient been eating poorly because of a decreased appetite?: 0 Malnutrition Screening Tool Score: 0    Physical Exam HENT:     Head: Normocephalic.     Nose: Nose normal.  Eyes:     Pupils: Pupils are equal, round, and reactive to light.  Cardiovascular:     Rate and Rhythm: Normal rate.  Pulmonary:     Effort: Pulmonary effort is normal.  Musculoskeletal:        General: Normal range of motion.     Cervical back: Normal range of motion.  Skin:    General: Skin is warm.  Neurological:     General: No focal deficit present.     Mental Status: She is alert.  Psychiatric:        Mood and Affect: Mood normal.        Behavior: Behavior normal.        Thought Content: Thought content normal.        Judgment: Judgment normal.   Review of Systems  Constitutional: Negative.   HENT: Negative.    Eyes: Negative.   Respiratory: Negative.    Cardiovascular: Negative.   Gastrointestinal: Negative.   Genitourinary: Negative.   Musculoskeletal: Negative.   Skin: Negative.   Neurological: Negative.   Psychiatric/Behavioral:  Positive for substance abuse. The patient is nervous/anxious.     Blood pressure (!) 142/87, pulse 100, temperature 99 F (37.2 C), temperature source Oral, resp. rate 18, SpO2 99%. There is no height or weight on file to calculate BMI.  Past Psychiatric History: Alcohol abuse, cocaine abuse, marijuana,   Is the patient at risk to self? No  Has the patient been a risk to self in the past 6 months? No .    Has the patient been a risk to self within the distant past? No   Is the patient a risk to others? No   Has the patient been a risk to others in the past 6 months? No   Has the patient  been a risk to others within the distant past? No   Past Medical History: see chart Family History: unknown Social History: marijuana abuse, cocaine, pain medication abuse, alcohol abuse  Last Labs:  No visits with results within 6 Month(s) from this visit.  Latest known visit with results is:  Admission on 01/04/2022, Discharged on 01/04/2022  Component Date Value Ref Range Status  . INFLUENZA A ANTIGEN, POC 01/04/2022 NEGATIVE  NEGATIVE Final  . INFLUENZA B ANTIGEN, POC 01/04/2022 NEGATIVE  NEGATIVE Final  . SARS Coronavirus 2 01/04/2022 NEGATIVE  NEGATIVE Final   Comment: (NOTE) SARS-CoV-2 target nucleic acids are NOT DETECTED.  The SARS-CoV-2 RNA is generally detectable in upper and lower respiratory specimens during the acute phase of infection. Negative results do not preclude SARS-CoV-2 infection, do not rule out co-infections with other pathogens, and should not be used as  the sole basis for treatment or other patient management decisions. Negative results must be combined with clinical observations, patient history, and epidemiological information. The expected result is Negative.  Fact Sheet for Patients: HairSlick.no  Fact Sheet for Healthcare Providers: quierodirigir.com  This test is not yet approved or cleared by the [place] FDA and  has been authorized for detection and/or diagnosis of SARS-CoV-2 by FDA under an Emergency Use Authorization (EUA). This EUA will remain  in effect (meaning this test can be used) for the duration of the COVID-19 declaration under Se                          ction 564(b)(1) of the Act, 21 U.S.C. section 360bbb-3(b)(1), unless the authorization is terminated or revoked sooner.  Performed at [facility], [address], [place], [place] [address]     Allergies: Erythromycin and Aspirin  Medications:  Facility Ordered Medications  Medication  . acetaminophen  (TYLENOL) tablet 650 mg  . alum & mag hydroxide-simeth (MAALOX/MYLANTA) 200-200-20 MG/5ML suspension 30 mL  . magnesium hydroxide (MILK OF MAGNESIA) suspension 30 mL   PTA Medications  Medication Sig  . LUMIGAN 0.01 % SOLN Place 1 drop into both eyes every evening.  Marland Kitchen omeprazole (PRILOSEC) 20 MG capsule Take 20 mg by mouth 3 (three) times daily.   Marland Kitchen lisinopril (PRINIVIL,ZESTRIL) 20 MG tablet Take 20 mg by mouth daily.  . cetirizine (ZYRTEC) 10 MG tablet Take 10 mg by mouth daily.  . Vitamin D, Ergocalciferol, (DRISDOL) 50000 units CAPS capsule Take 50,000 Units by mouth every Tuesday.  . triamcinolone cream (KENALOG) 0.1 % Apply 1 application topically daily as needed (on affected areas(s) on skin).   . Insulin Pen Needle 31G X 5 MM MISC 1 Units by Does not apply route as directed.  . Olopatadine HCl (PAZEO) 0.7 % SOLN Apply 1 drop to eye daily. (Patient taking differently: Place 1 drop into both eyes daily. )  . fluticasone (FLONASE) 50 MCG/ACT nasal spray Place 1 spray into both nostrils 2 (two) times daily.  . Empagliflozin (JARDIANCE PO) Take by mouth.  . hydrOXYzine (ATARAX) 50 MG tablet Take 1 tablet (50 mg total) by mouth every 8 (eight) hours as needed.    Long Term Goals: Improvement in symptoms so as ready for discharge  Short Term Goals: Patient will verbalize feelings in meetings with treatment team members., Patient will attend at least of 50% of the groups daily., Pt will complete the PHQ9 on admission, day 3 and discharge., Patient will participate in completing the [place] Suicide Severity Rating Scale, Patient will score a low risk of violence for 24 hours prior to discharge, and Patient will take medications as prescribed daily.  Medical Decision Making  Inpatient [facility]    Recommendations  Based on my evaluation the patient does not appear to have an emergency medical condition.  [name], NP 09/22/23  8:53 PM
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
## 2023-09-22 NOTE — ED Notes (Signed)
Pt was ok with doing admission orders until It came time to do her skin check she became verbally aggressive and was stating this is jail. She was also upset due to she could not have her personal belongings with her ,she was cursing at staff while in the assessment hallways. Writer reported pt being upset to [place] NP and he went and tried to talk with her and find out how we can make her fell better and safe she cursed him out and said let me out of here now she did not want to hear anything anyone  had to say

## 2023-09-23 LAB — ETHANOL: Alcohol, Ethyl (B): <10 mg/dL (ref <10)

## 2023-09-23 NOTE — ED Provider Notes (Signed)
Writer was informed  by nursing that patient refuse to stay and want to lean AMA.  According to nursing staff patient was complaining that she felt like she was in prison and that pt has refuse to comply with skin assessment.  Pt also was complaining that she was not allowed keep, here belonging with her.

## 2023-10-14 NOTE — Plan of Care (Signed)
 [facility] Tonsillectomy/Adenoidectomy, Postoperative PEDS care plan entered in error.

## 2023-12-19 ENCOUNTER — Telehealth: Payer: Self-pay

## 2023-12-19 NOTE — Telephone Encounter (Signed)
Copied from CRM [id number]. Topic: Appointments - Appointment Scheduling >> Dec 19, 2023 12:25 PM Tiffany H wrote: Patient called to establish care. Patient advised that she was diagnosed with Leukemia but is looking for new care for long-term treatment. Patient requires behavioral health. Patient is scheduled to see [facility]. Please call patient with a number to local mental health services.   Please see message above regarding pt est care in January. Called patient to advise mental health resources available to her per [facility] request. No answer and vm box full. Unable to speak with patient or leave a message.

## 2023-12-19 NOTE — Telephone Encounter (Signed)
If able to reach pt, ok to give pt our behavioral health phone number to call and schedule appointment, should not need referral. Thx

## 2024-01-18 ENCOUNTER — Encounter: Payer: Self-pay | Admitting: Family

## 2024-01-18 ENCOUNTER — Ambulatory Visit: Payer: 59 | Admitting: Family

## 2024-01-18 VITALS — BP 126/84 | HR 85 | Temp 97.3°F | Ht 64.0 in | Wt 171.4 lb

## 2024-01-18 DIAGNOSIS — I152 Hypertension secondary to endocrine disorders: Secondary | ICD-10-CM

## 2024-01-18 DIAGNOSIS — E1159 Type 2 diabetes mellitus with other circulatory complications: Secondary | ICD-10-CM

## 2024-01-18 DIAGNOSIS — D45 Polycythemia vera: Secondary | ICD-10-CM

## 2024-01-18 DIAGNOSIS — F339 Major depressive disorder, recurrent, unspecified: Secondary | ICD-10-CM

## 2024-01-18 DIAGNOSIS — L308 Other specified dermatitis: Secondary | ICD-10-CM

## 2024-01-18 DIAGNOSIS — E1169 Type 2 diabetes mellitus with other specified complication: Secondary | ICD-10-CM | POA: Diagnosis not present

## 2024-01-18 DIAGNOSIS — L309 Dermatitis, unspecified: Secondary | ICD-10-CM | POA: Insufficient documentation

## 2024-01-18 DIAGNOSIS — J309 Allergic rhinitis, unspecified: Secondary | ICD-10-CM

## 2024-01-18 DIAGNOSIS — Z794 Long term (current) use of insulin: Secondary | ICD-10-CM | POA: Diagnosis not present

## 2024-01-18 DIAGNOSIS — T7421XA Adult sexual abuse, confirmed, initial encounter: Secondary | ICD-10-CM

## 2024-01-18 MED ORDER — DEXCOM G7 RECEIVER DEVI
1 refills | Status: DC
Start: 2024-01-18 — End: 2024-03-01

## 2024-01-18 MED ORDER — EMPAGLIFLOZIN 10 MG PO TABS
10.0000 mg | ORAL_TABLET | Freq: Every day | ORAL | 5 refills | Status: DC
Start: 2024-01-18 — End: 2024-05-04

## 2024-01-18 MED ORDER — FLUTICASONE PROPIONATE 50 MCG/ACT NA SUSP: 1.0000 | Freq: Two times a day (BID) | NASAL | 5 refills | Status: DC

## 2024-01-18 MED ORDER — DEXCOM G7 SENSOR MISC: 11 refills | Status: DC

## 2024-01-18 MED ORDER — TRIAMCINOLONE ACETONIDE 0.1 % EX CREA
1.0000 | TOPICAL_CREAM | Freq: Every day | CUTANEOUS | 0 refills | Status: DC | PRN
Start: 2024-01-18 — End: 2024-02-17
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
## 2024-01-18 NOTE — Assessment & Plan Note (Signed)
Currently on Mounjaro 5mg  qweek & Jardiance qd and reports good control with recent A1c of 5.7-5.9.  Just needs Jardiance refill today. -Refill Dexcom G7 prescription through [facility].   -Sending Jardiance refill as prescribed.   -REquest previous PCP records including labs. -F/U in 3 mos

## 2024-01-18 NOTE — Assessment & Plan Note (Signed)
Blood pressure well controlled at the visit. Currently on Lisinopril 20mg .   -Continue Lisinopril as prescribed.   -F/U in 3 mos
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
## 2024-01-18 NOTE — Assessment & Plan Note (Signed)
Patient reports a previous diagnosis of polycythemia vera. No current treatment mentioned.  No record of DX in EMR. -Request previous medical records for confirmation of diagnosis.   -Refer to hematology/oncology for further evaluation and management once records are received.

## 2024-01-18 NOTE — Assessment & Plan Note (Signed)
Scaly, dry patches on skin, uses steroid cream, needing refill. -Sending refill of Triamcinolone 0.1% cream bid with refills. -F/U prn

## 2024-01-18 NOTE — Patient Instructions (Addendum)
Welcome to [facility], It was a pleasure meeting you today!    As discussed, I have sent your refills to your pharmacy.  I have sent a referral to our psychiatry office.  I have sent a referral to our therapy team for counseling. But you can call their office to schedule your first appointment. [facility] at [phone].   IF YOU FEEL SUICIDAL, dial [phone] and can speak to someone.  Please schedule a 3 month follow up visit today.     PLEASE NOTE: If you had any LAB tests please let us know if you have not heard back within a few days. You may see your results on MyChart before we have a chance to review them but we will give you a call once they are reviewed by [place]. If we ordered any REFERRALS today, please let us know if you have not heard from their office within the next week.  Let us know through MyChart if you are needing REFILLS, or have your pharmacy send [place] the request. You can also use MyChart to communicate with me or any office staff.  Please try these tips to maintain a healthy lifestyle: It is important that you exercise regularly at least 30 minutes 5 times a week. Think about what you will eat, plan ahead. Choose whole foods, & think  "clean, green, fresh or frozen" over canned, processed or packaged foods which are more sugary, salty, and fatty. 70 to 75% of food eaten should be fresh vegetables and protein. 2-3  meals daily with healthy snacks between meals, but must be whole fruit, protein or vegetables. Aim to eat over a 10 hour period when you are active, for example, 7am to 5pm, and then STOP after your last meal of the day, drinking only water.  Shorter eating windows, 6-8 hours, are showing benefits in heart disease and blood sugar regulation. Drink water every day! Shoot for 64 ounces daily = 8 cups, no other drink is as healthy! Fruit juice is best enjoyed in a healthy way, by EATING the fruit.

## 2024-01-19 ENCOUNTER — Telehealth: Payer: Self-pay

## 2024-01-19 ENCOUNTER — Other Ambulatory Visit (HOSPITAL_COMMUNITY): Payer: Self-pay

## 2024-01-19 NOTE — Telephone Encounter (Signed)
Pharmacy Patient Advocate Encounter   Received notification from CoverMyMeds that prior authorization for [facility] G7 Receiver device is required/requested.   Insurance verification completed.   The patient is insured through [facility] Medicare Part D .   Per test claim: PA required; PA submitted to above mentioned insurance via CoverMyMeds Key/confirmation #/EOC [id number] Status is pending

## 2024-01-19 NOTE — Telephone Encounter (Signed)
Copied from CRM [id number]. Topic: Clinical - Prescription Issue >> Jan 19, 2024  3:47 PM Sonny Dandy B wrote: Reason for CRM: Pt called to advise Continuous Glucose Receiver (DEXCOM G7 RECEIVER) DEVI, Continuous Glucose Sensor (DEXCOM G7 SENSOR) MISC both require a prior authorization from pt's insurance [employer]. Please is requesting a prior Berkley Harvey is requested, and sent to the pharmacy. Please  [id number]   Please start PA for patient.

## 2024-01-19 NOTE — Telephone Encounter (Signed)
Copied from CRM [id number]. Topic: Clinical - Medication Question >> Jan 19, 2024  4:24 PM Larwance Sachs wrote: Reason for CRM: Mariana Kaufman called in from united healthcare regarding Continuous Glucose Receiver (DEXCOM G7 RECEIVER) DEVI, stated they would need a prior authorization from Dulce Sellar, NP   Request sent to PA team

## 2024-01-30 NOTE — Telephone Encounter (Signed)
 Pharmacy Patient Advocate Encounter  Received notification from [facility] Medicare Part D that Prior Authorization for [facility] G7 Receiver device has been APPROVED from 01-20-2024 to 12-19-2024   PA #/Case ID/Reference #: [id number]

## 2024-01-30 NOTE — Telephone Encounter (Signed)
 I called pt in regards to approval, pt verbalized understanding. Pt c/o chronic allergic rhinitis and would like a RX for mullein. Please advise.

## 2024-01-31 NOTE — Telephone Encounter (Signed)
This is an over the counter herbal remedy, I have not seen as a prescription. She can look for at an organic food store or Herbal/Vitamin store locally.

## 2024-02-02 ENCOUNTER — Telehealth: Payer: Self-pay

## 2024-02-02 NOTE — Telephone Encounter (Signed)
I called pt , unable to lvm.

## 2024-02-02 NOTE — Telephone Encounter (Signed)
Copied from CRM [id number]. Topic: General - Other >> Feb 02, 2024  9:48 AM Irine Seal wrote: Reason for CRM: patient returning missed call from Candie Chroman, CMA, in regards to request RX for mullein. Relayed provider message, she stated she is aware it can be purchased Over the counter, but it is more expensive she is wanting recommendations on something that is equal to that and more affordable. Patient requests callback [phone]

## 2024-02-05 NOTE — Telephone Encounter (Signed)
What is she taking it for? It has several different uses.

## 2024-02-07 ENCOUNTER — Telehealth (INDEPENDENT_AMBULATORY_CARE_PROVIDER_SITE_OTHER): Payer: 59 | Admitting: Family

## 2024-02-07 ENCOUNTER — Encounter: Payer: Self-pay | Admitting: Family

## 2024-02-07 VITALS — Ht 64.0 in

## 2024-02-07 DIAGNOSIS — F4312 Post-traumatic stress disorder, chronic: Secondary | ICD-10-CM | POA: Diagnosis not present

## 2024-02-07 DIAGNOSIS — I152 Hypertension secondary to endocrine disorders: Secondary | ICD-10-CM

## 2024-02-07 DIAGNOSIS — E1159 Type 2 diabetes mellitus with other circulatory complications: Secondary | ICD-10-CM

## 2024-02-07 MED ORDER — LISINOPRIL 30 MG PO TABS: 30.0000 mg | ORAL_TABLET | Freq: Every morning | ORAL | 5 refills | Status: DC
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
## 2024-02-07 NOTE — Telephone Encounter (Signed)
Let her know I have no idea on cost of herbs, but they can be pricey, especially for good quality. Similar to [facility], she can try Quercetin, 500mg  twice a day or Butterbur (should have 7.5mg  of petasin) 50mg  twice a day. In addition I always recommend nasal saline rinses daily either with a Neti pot (1-2x/day) or nasal spray 3x/day - this is first line prevention to disinfect sinuses!

## 2024-02-07 NOTE — Telephone Encounter (Signed)
FYI ,  I called pt to let her know of OTC remedies per Dulce Sellar, NP. Pt states she is unable to afford below suggestions. I spoke with Judeth Cornfield, She will be sending over Nasacort for pt to her pharmacy. Pt verbalized understanding. Pt also states she was dx with anxiety in the past but it has worsened due to her cousin passing away yesterday morning. I have scheduled pt for VV today at 11:45 am with Dulce Sellar, NP to discuss further treatment.

## 2024-02-09 DIAGNOSIS — F4312 Post-traumatic stress disorder, chronic: Secondary | ICD-10-CM | POA: Insufficient documentation

## 2024-02-09 NOTE — Assessment & Plan Note (Signed)
History of prolonged trauma and current symptoms of anxiety and depression. Previous misdiagnosis and ineffective treatment with various antidepressants including Prozac, Zoloft, Celexa, Lexapro, and Effexor. Therapy was reported as beneficial. -Follow up on referral sent 3 weeks ago to psychiatry for further evaluation and management. -Consider starting Prozac pending further discussion with psychiatry. -F/U prn

## 2024-02-09 NOTE — Assessment & Plan Note (Signed)
Current medication lisinopril 30mg  qd. BP well controlled last visit. -Continue lisinopril as prescribed, sending refill. -F/U in 6 mos.

## 2024-02-11 ENCOUNTER — Other Ambulatory Visit: Payer: Self-pay | Admitting: Family

## 2024-02-11 DIAGNOSIS — L308 Other specified dermatitis: Secondary | ICD-10-CM

## 2024-02-16 ENCOUNTER — Other Ambulatory Visit: Payer: Self-pay | Admitting: Family

## 2024-02-16 ENCOUNTER — Telehealth: Payer: Self-pay

## 2024-02-16 MED ORDER — FLUCONAZOLE 150 MG PO TABS: 150.0000 mg | ORAL_TABLET | Freq: Every day | ORAL | 0 refills | Status: DC

## 2024-02-16 NOTE — Telephone Encounter (Signed)
 Copied from CRM [id number]. Topic: Clinical - Medical Advice >> Feb 15, 2024  4:12 PM Almira Coaster wrote: Reason for CRM: Patient was seen on 02/07/2024 and prescribed an antibiotic, patient states everything she takes antibiotics she develops a yeast infect. She is currently experiencing the symptoms and would like to know if Dulce Sellar can send something to the pharmacy for that.  Message has been sent to [place] to address

## 2024-02-27 ENCOUNTER — Other Ambulatory Visit: Payer: Self-pay | Admitting: Family

## 2024-02-27 DIAGNOSIS — L308 Other specified dermatitis: Secondary | ICD-10-CM

## 2024-03-01 ENCOUNTER — Other Ambulatory Visit: Payer: Self-pay

## 2024-03-01 ENCOUNTER — Telehealth: Payer: Self-pay

## 2024-03-01 DIAGNOSIS — E1169 Type 2 diabetes mellitus with other specified complication: Secondary | ICD-10-CM

## 2024-03-01 DIAGNOSIS — I152 Hypertension secondary to endocrine disorders: Secondary | ICD-10-CM

## 2024-03-01 DIAGNOSIS — J309 Allergic rhinitis, unspecified: Secondary | ICD-10-CM

## 2024-03-01 MED ORDER — FLUTICASONE PROPIONATE 50 MCG/ACT NA SUSP: 1.0000 | Freq: Two times a day (BID) | NASAL | 5 refills | Status: DC

## 2024-03-01 MED ORDER — DEXCOM G7 RECEIVER DEVI: 1 refills | Status: AC

## 2024-03-01 NOTE — Telephone Encounter (Signed)
 Copied from CRM [id number]. Topic: Clinical - Medication Question >> Feb 29, 2024  2:05 PM Fredrich Romans wrote: Reason for CRM: Patient called in stating that pharmacy says that she doesn't have any refills on fluticasone (FLONASE) 50 MCG/ACT nasal spray, Continuous Glucose Sensor (DEXCOM G7 SENSOR) MISC  She just had prescription sent over on 01/18/2024.   RX resent.

## 2024-04-19 ENCOUNTER — Telehealth: Payer: Self-pay

## 2024-04-19 ENCOUNTER — Other Ambulatory Visit: Payer: Self-pay

## 2024-04-19 ENCOUNTER — Other Ambulatory Visit: Payer: Self-pay | Admitting: Family

## 2024-04-19 DIAGNOSIS — Z794 Long term (current) use of insulin: Secondary | ICD-10-CM

## 2024-04-19 MED ORDER — ONETOUCH ULTRA VI STRP: ORAL_STRIP | 2 refills | Status: DC

## 2024-04-19 NOTE — Telephone Encounter (Signed)
 Copied from CRM [id number]. Topic: Clinical - Medication Refill >> Apr 19, 2024 11:07 AM Jethro Morrison wrote: Most Recent Primary Care Visit:  Provider: Versa Gore  Department: [facility]  Visit Type: MYCHART VIDEO VISIT  Date: 02/07/2024  Medication: ONETOUCH ULTRA test strip  Has the patient contacted their pharmacy? Yes (Agent: If no, request that the patient contact the pharmacy for the refill. If patient does not wish to contact the pharmacy document the reason why and proceed with request.) (Agent: If yes, when and what did the pharmacy advise?)  Is this the correct pharmacy for this prescription? Yes If no, delete pharmacy and type the correct one.  This is the patient's preferred pharmacy:  [id number] - [place], [place] - [address] AT [facility] [address] [place] [address] Phone: [phone] Fax: [phone]   Has the prescription been filled recently? Yes  Is the patient out of the medication? Yes  Has the patient been seen for an appointment in the last year OR does the patient have an upcoming appointment? Yes  Can we respond through MyChart? No  Agent: Please be advised that Rx refills may take up to 3 business days. We ask that you follow-up with your pharmacy.

## 2024-04-19 NOTE — Telephone Encounter (Signed)
 Copied from CRM [id number]. Topic: Clinical - Medication Refill >> Apr 19, 2024 11:07 AM Jethro Morrison wrote: Most Recent Primary Care Visit:  Provider: Versa Gore  Department: [facility]  Visit Type: MYCHART VIDEO VISIT  Date: 02/07/2024  Medication: ONETOUCH ULTRA test strip  Has the patient contacted their pharmacy? Yes (Agent: If no, request that the patient contact the pharmacy for the refill. If patient does not wish to contact the pharmacy document the reason why and proceed with request.) (Agent: If yes, when and what did the pharmacy advise?)  Is this the correct pharmacy for this prescription? Yes If no, delete pharmacy and type the correct one.  This is the patient's preferred pharmacy:  [id number] - [place] [place] - [address] AT [facility] [address]  [place] [address] Phone: [phone] Fax: [phone]   Has the prescription been filled recently? Yes  Is the patient out of the medication? Yes  Has the patient been seen for an appointment in the last year OR does the patient have an upcoming appointment? Yes  Can we respond through MyChart? No  Agent: Please be advised that Rx refills may take up to 3 business days. We ask that you follow-up with your pharmacy.     Done, pt aware.

## 2024-04-24 ENCOUNTER — Ambulatory Visit (INDEPENDENT_AMBULATORY_CARE_PROVIDER_SITE_OTHER)

## 2024-04-24 VITALS — Ht 63.5 in | Wt 171.0 lb

## 2024-04-24 DIAGNOSIS — E1159 Type 2 diabetes mellitus with other circulatory complications: Secondary | ICD-10-CM

## 2024-04-24 DIAGNOSIS — Z0000 Encounter for general adult medical examination without abnormal findings: Secondary | ICD-10-CM | POA: Diagnosis not present

## 2024-04-24 DIAGNOSIS — I152 Hypertension secondary to endocrine disorders: Secondary | ICD-10-CM

## 2024-04-24 NOTE — Patient Instructions (Signed)
 Sara Juarez , Thank you for taking time to come for your Medicare Wellness Visit. I appreciate your ongoing commitment to your health goals. Please review the following plan we discussed and let me know if I can assist you in the future.   Referrals/Orders/Follow-Ups/Clinician Recommendations: Aim for 30 minutes of exercise or brisk walking, 6-8 glasses of water, and 5 servings of fruits and vegetables each day.   This is a list of the screening recommended for you and due dates:  Health Maintenance  Topic Date Due   Eye exam for diabetics  Never done   Yearly kidney health urinalysis for diabetes  Never done   Hemoglobin A1C  10/16/2017   Complete foot exam   06/27/2024*   Mammogram  02/06/2025*   Colon Cancer Screening  02/06/2025*   Hepatitis C Screening  02/06/2025*   Flu Shot  07/20/2024   Yearly kidney function blood test for diabetes  09/21/2024   Medicare Annual Wellness Visit  04/24/2025   DTaP/Tdap/Td vaccine (2 - Td or Tdap) 05/09/2029   HIV Screening  Completed   HPV Vaccine  Aged Out   Meningitis B Vaccine  Aged Out   Pneumococcal Vaccination  Discontinued   COVID-19 Vaccine  Discontinued   Zoster (Shingles) Vaccine  Discontinued  *Topic was postponed. The date shown is not the original due date.    Advanced directives: (Declined) Advance directive discussed with you today. Even though you declined this today, please call our office should you change your mind, and we can give you the proper paperwork for you to fill out.  Next Medicare Annual Wellness Visit scheduled for next year: Yes  Have you seen your provider in the last 6 months (3 months if uncontrolled diabetes)? Yes 01/18/24

## 2024-04-24 NOTE — Progress Notes (Deleted)
 Subjective:   Fusae Eller Thalman is a 63 y.o. who presents for a Medicare Wellness preventive visit.  Visit Complete: Virtual I connected with  Jori A Pugh on 04/24/24 by a audio enabled telemedicine application and verified that I am speaking with the correct person using two identifiers.  Patient Location: Home  Provider Location: Office/Clinic  I discussed the limitations of evaluation and management by telemedicine. The patient expressed understanding and agreed to proceed.  Vital Signs: Because this visit was a virtual/telehealth visit, some criteria may be missing or patient reported. Any vitals not documented were not able to be obtained and vitals that have been documented are patient reported.  VideoDeclined- This patient declined Librarian, academic. Therefore the visit was completed with audio only.  Persons Participating in Visit: Patient.  AWV Questionnaire: No: Patient Medicare AWV questionnaire was not completed prior to this visit.  Cardiac Risk Factors include: advanced age (>63men, >59 women);diabetes mellitus;hypertension;smoking/ tobacco exposure     Objective:    Today's Vitals   04/24/24 1331  Weight: 171 lb (77.6 kg)  Height: 5' 3.5" (1.613 m)  PainSc: 8    Body mass index is 29.82 kg/m.     04/24/2024    1:40 PM 08/19/2019    8:40 AM 06/25/2019    4:12 PM 05/10/2019    9:47 PM 05/09/2019    3:56 AM 04/10/2019    1:07 PM 03/09/2019    2:52 PM  Advanced Directives  Does Patient Have a Medical Advance Directive? No  No No  No No  Would patient like information on creating a medical advance directive? No - Patient declined  No - Patient declined   No - Guardian declined No - Patient declined     Information is confidential and restricted. Go to Review Flowsheets to unlock data.    Current Medications (verified) Outpatient Encounter Medications as of 04/24/2024  Medication Sig   aspirin EC 81 MG tablet Take 325 mg  by mouth daily.   Continuous Glucose Receiver (DEXCOM G7 RECEIVER) DEVI Please provide 1 dexcom g7 receiver   Continuous Glucose Sensor (DEXCOM G7 SENSOR) MISC Apply sensor every 10 days. Pharmacy please provide 3 boxes (1 sensor per week)   cyclobenzaprine  (FLEXERIL ) 10 MG tablet Take 10 mg by mouth 2 (two) times daily as needed.   empagliflozin  (JARDIANCE ) 10 MG TABS tablet Take 1 tablet (10 mg total) by mouth daily.   fluticasone  (FLONASE ) 50 MCG/ACT nasal spray Place 1 spray into both nostrils 2 (two) times daily.   Insulin  Lispro Prot & Lispro (HUMALOG 75/25 MIX) (75-25) 100 UNIT/ML Kwikpen Inject into the skin as needed (When CBG elevated).   lisinopril  (ZESTRIL ) 30 MG tablet Take 1 tablet (30 mg total) by mouth every morning.   LUMIGAN 0.01 % SOLN Place 1 drop into both eyes every evening.   MOUNJARO 5 MG/0.5ML Pen Inject 5 mg into the skin once a week.   omeprazole (PRILOSEC) 20 MG capsule Take 20 mg by mouth 3 (three) times daily.   ONETOUCH ULTRA test strip Two times daily as instructed.   triamcinolone  cream (KENALOG ) 0.1 % APPLY 1 APPLICATION TOPICALLY DAILY AS NEEDED (ON AFFECTED AREAS(S) ON SKIN).   cycloSPORINE (RESTASIS) 0.05 % ophthalmic emulsion Apply to eye. (Patient not taking: Reported on 04/24/2024)   [DISCONTINUED] fluconazole  (DIFLUCAN ) 150 MG tablet Take 1 tablet (150 mg total) by mouth daily.   [DISCONTINUED] insulin  aspart protamine - aspart (NOVOLOG  70/30 MIX) (70-30) 100 UNIT/ML FlexPen Take  35 units with breakfast and take 30 units with supper everyday. (Patient not taking: Reported on 05/18/2017)   [DISCONTINUED] metFORMIN  (GLUCOPHAGE  XR) 500 MG 24 hr tablet Take 2 tabs with breakfast and 2 tabs with supper everyday (Patient not taking: Reported on 04/10/2019)   No facility-administered encounter medications on file as of 04/24/2024.    Allergies (verified) Erythromycin and Aspirin   History: Past Medical History:  Diagnosis Date   Anxiety    Asthma    Cervical  herniated disc    Chronic pain 04/15/2017   Constipation 04/15/2017   Diabetes mellitus without complication (HCC)    Glaucoma (increased eye pressure)    HTN (hypertension) 02/21/2017   Pyelonephritis 02/21/2017   Past Surgical History:  Procedure Laterality Date   BILATERAL CARPAL TUNNEL RELEASE     BLADDER SUSPENSION     NASAL SINUS SURGERY     PARTIAL HYSTERECTOMY     Family History  Problem Relation Age of Onset   Hypertension Mother    Diabetes Mother    Hypertension Father    Diabetes Father    Colon cancer Father    Social History   Socioeconomic History   Marital status: Single    Spouse name: Not on file   Number of children: Not on file   Years of education: Not on file   Highest education level: Not on file  Occupational History   Not on file  Tobacco Use   Smoking status: Every Day    Current packs/day: 0.75    Types: Cigarettes   Smokeless tobacco: Never  Vaping Use   Vaping status: Never Used  Substance and Sexual Activity   Alcohol use: Not Currently    Alcohol/week: 2.0 standard drinks of alcohol    Types: 2 Shots of liquor per week    Comment: shots    Drug use: Not Currently    Types: Cocaine   Sexual activity: Not on file  Other Topics Concern   Not on file  Social History Narrative   Not on file   Social Drivers of Health   Financial Resource Strain: Low Risk  (04/24/2024)   Overall Financial Resource Strain (CARDIA)    Difficulty of Paying Living Expenses: Not hard at all  Food Insecurity: No Food Insecurity (04/24/2024)   Hunger Vital Sign    Worried About Running Out of Food in the Last Year: Never true    Ran Out of Food in the Last Year: Never true  Transportation Needs: No Transportation Needs (04/24/2024)   PRAPARE - Administrator, Civil Service (Medical): No    Lack of Transportation (Non-Medical): No  Physical Activity: Inactive (04/24/2024)   Exercise Vital Sign    Days of Exercise per Week: 0 days    Minutes of  Exercise per Session: 0 min  Stress: No Stress Concern Present (04/24/2024)   [employer] of Occupational Health - Occupational Stress Questionnaire    Feeling of Stress : Only a little  Social Connections: Moderately Isolated (04/24/2024)   Social Connection and Isolation Panel [NHANES]    Frequency of Communication with Friends and Family: More than three times a week    Frequency of Social Gatherings with Friends and Family: More than three times a week    Attends Religious Services: More than 4 times per year    Active Member of [employer] or Organizations: No    Attends Banker Meetings: Never    Marital Status: Never married  Tobacco Counseling Ready to quit: Not Answered Counseling given: Not Answered    Clinical Intake:  Pre-visit preparation completed: Yes  Pain : 0-10 Pain Score: 8  Pain Type: Chronic pain Pain Location: Generalized Pain Descriptors / Indicators: Aching Pain Onset: More than a month ago Pain Frequency: Constant     BMI - recorded: 29.82 Nutritional Status: BMI 25 -29 Overweight Nutritional Risks: None Diabetes: Yes CBG done?: Yes (123 per pt) CBG resulted in Enter/ Edit results?: No Did pt. bring in CBG monitor from home?: No  Lab Results  Component Value Date   HGBA1C 14.5 (H) 04/16/2017   HGBA1C 11.3 (H) 02/23/2017     How often do you need to have someone help you when you read instructions, pamphlets, or other written materials from your doctor or pharmacy?: 1 - Never  Interpreter Needed?: No  Information entered by :: Lamont Pilsner, LPN   Activities of Daily Living     04/24/2024    1:33 PM  In your present state of health, do you have any difficulty performing the following activities:  Hearing? 0  Vision? 0  Difficulty concentrating or making decisions? 0  Walking or climbing stairs? 1  Comment fight through but i live upstairs  Dressing or bathing? 0  Doing errands, shopping? 0  Preparing Food and eating  ? Y  Comment at times due to hand strength  Using the Toilet? N  In the past six months, have you accidently leaked urine? N  Do you have problems with loss of bowel control? N  Managing your Medications? N  Managing your Finances? N  Housekeeping or managing your Housekeeping? N    Patient Care Team: Versa Gore, NP as PCP - General (Family Medicine)  Indicate any recent Medical Services you may have received from other than [facility] providers in the past year (date may be approximate).     Assessment:   This is a routine wellness examination for Anjenette.  Hearing/Vision screen Hearing Screening - Comments:: Denies any hearing issues  Vision Screening - Comments:: Wears rx glasses - up to date with routine eye exams with [facility]     Goals Addressed             This Visit's Progress    Patient Stated       Get leukemia under control       Depression Screen     04/24/2024    1:38 PM 01/18/2024    2:46 PM 09/22/2023    8:47 PM 05/18/2017   10:28 AM  PHQ 2/9 Scores  PHQ - 2 Score 1 5  0  PHQ- 9 Score  19       Information is confidential and restricted. Go to Review Flowsheets to unlock data.    Fall Risk     04/24/2024    1:41 PM 05/18/2017   10:28 AM  Fall Risk   Falls in the past year? 0 No  Number falls in past yr: 0   Injury with Fall? 0   Risk for fall due to : Impaired mobility   Follow up Falls prevention discussed     MEDICARE RISK AT HOME:  Medicare Risk at Home Any stairs in or around the home?: Yes If so, are there any without handrails?: No Home free of loose throw rugs in walkways, pet beds, electrical cords, etc?: Yes Adequate lighting in your home to reduce risk of falls?: Yes Life alert?: No Use of a cane, walker  or w/c?: No Grab bars in the bathroom?: No Shower chair or bench in shower?: Yes Elevated toilet seat or a handicapped toilet?: No  TIMED UP AND GO:  Was the test performed?  No  Cognitive Function: 6CIT completed         04/24/2024    1:43 PM  6CIT Screen  What Year? 0 points  What month? 0 points  What time? 0 points  Count back from 20 0 points  Months in reverse 0 points  Repeat phrase 0 points  Total Score 0 points    Immunizations Immunization History  Administered Date(s) Administered   Tdap 05/10/2019    Screening Tests Health Maintenance  Topic Date Due   OPHTHALMOLOGY EXAM  Never done   Diabetic kidney evaluation - Urine ACR  Never done   HEMOGLOBIN A1C  10/16/2017   FOOT EXAM  06/27/2024 (Originally 07/20/1971)   MAMMOGRAM  02/06/2025 (Originally 07/20/2010)   Colonoscopy  02/06/2025 (Originally 07/20/2005)   Hepatitis C Screening  02/06/2025 (Originally 07/20/1978)   INFLUENZA VACCINE  07/20/2024   Diabetic kidney evaluation - eGFR measurement  09/21/2024   Medicare Annual Wellness (AWV)  04/24/2025   DTaP/Tdap/Td (2 - Td or Tdap) 05/09/2029   HIV Screening  Completed   HPV VACCINES  Aged Out   Meningococcal B Vaccine  Aged Out   Pneumococcal Vaccine 95-36 Years old  Discontinued   COVID-19 Vaccine  Discontinued   Zoster Vaccines- Shingrix  Discontinued    Health Maintenance  Health Maintenance Due  Topic Date Due   OPHTHALMOLOGY EXAM  Never done   Diabetic kidney evaluation - Urine ACR  Never done   HEMOGLOBIN A1C  10/16/2017   Health Maintenance Items Addressed: See Nurse Notes  Additional Screening:  Vision Screening: Recommended annual ophthalmology exams for early detection of glaucoma and other disorders of the eye.  Dental Screening: Recommended annual dental exams for proper oral hygiene  Community Resource Referral / Chronic Care Management: CRR required this visit?  No   CCM required this visit?  No     Plan:     I have personally reviewed and noted the following in the patient's chart:   Medical and social history Use of alcohol, tobacco or illicit drugs  Current medications and supplements including opioid prescriptions. Patient is not  currently taking opioid prescriptions. Functional ability and status Nutritional status Physical activity Advanced directives List of other physicians Hospitalizations, surgeries, and ER visits in previous 12 months Vitals Screenings to include cognitive, depression, and falls Referrals and appointments  In addition, I have reviewed and discussed with patient certain preventive protocols, quality metrics, and best practice recommendations. A written personalized care plan for preventive services as well as general preventive health recommendations were provided to patient.     Bruno Capri, LPN   12/25/1094   After Visit Summary: (MyChart) Due to this being a telephonic visit, the after visit summary with patients personalized plan was offered to patient via MyChart   Notes: Nothing significant to report at this time.

## 2024-04-24 NOTE — Progress Notes (Signed)
 Subjective:   Sara Juarez is a 63 y.o. who presents for a Medicare Wellness preventive visit.  Visit Complete: Virtual I connected with  Sara Juarez on 04/24/24 by a video and audio enabled telemedicine application and verified that I am speaking with the correct person using two identifiers.  Patient Location: Home  Provider Location: Office/Clinic  I discussed the limitations of evaluation and management by telemedicine. The patient expressed understanding and agreed to proceed.  Vital Signs: Because this visit was a virtual/telehealth visit, some criteria may be missing or patient reported. Any vitals not documented were not able to be obtained and vitals that have been documented are patient reported.    Persons Participating in Visit: Patient.  AWV Questionnaire: No: Patient Medicare AWV questionnaire was not completed prior to this visit.  Cardiac Risk Factors include: advanced age (>4men, >53 women);diabetes mellitus;hypertension;smoking/ tobacco exposure     Objective:    Today's Vitals   04/24/24 1331  Weight: 171 lb (77.6 kg)  Height: 5' 3.5" (1.613 m)  PainSc: 8    Body mass index is 29.82 kg/m.     04/24/2024    1:40 PM 08/19/2019    8:40 AM 06/25/2019    4:12 PM 05/10/2019    9:47 PM 05/09/2019    3:56 AM 04/10/2019    1:07 PM 03/09/2019    2:52 PM  Advanced Directives  Does Patient Have a Medical Advance Directive? No  No No  No No  Would patient like information on creating a medical advance directive? No - Patient declined  No - Patient declined   No - Guardian declined No - Patient declined     Information is confidential and restricted. Go to Review Flowsheets to unlock data.    Current Medications (verified) Outpatient Encounter Medications as of 04/24/2024  Medication Sig   aspirin EC 81 MG tablet Take 325 mg by mouth daily.   Continuous Glucose Receiver (DEXCOM G7 RECEIVER) DEVI Please provide 1 dexcom g7 receiver   Continuous  Glucose Sensor (DEXCOM G7 SENSOR) MISC Apply sensor every 10 days. Pharmacy please provide 3 boxes (1 sensor per week)   cyclobenzaprine  (FLEXERIL ) 10 MG tablet Take 10 mg by mouth 2 (two) times daily as needed.   empagliflozin  (JARDIANCE ) 10 MG TABS tablet Take 1 tablet (10 mg total) by mouth daily.   fluticasone  (FLONASE ) 50 MCG/ACT nasal spray Place 1 spray into both nostrils 2 (two) times daily.   Insulin  Lispro Prot & Lispro (HUMALOG 75/25 MIX) (75-25) 100 UNIT/ML Kwikpen Inject into the skin as needed (When CBG elevated).   lisinopril  (ZESTRIL ) 30 MG tablet Take 1 tablet (30 mg total) by mouth every morning.   LUMIGAN 0.01 % SOLN Place 1 drop into both eyes every evening.   MOUNJARO 5 MG/0.5ML Pen Inject 5 mg into the skin once a week.   omeprazole (PRILOSEC) 20 MG capsule Take 20 mg by mouth 3 (three) times daily.   ONETOUCH ULTRA test strip Two times daily as instructed.   triamcinolone  cream (KENALOG ) 0.1 % APPLY 1 APPLICATION TOPICALLY DAILY AS NEEDED (ON AFFECTED AREAS(S) ON SKIN).   cycloSPORINE (RESTASIS) 0.05 % ophthalmic emulsion Apply to eye. (Patient not taking: Reported on 04/24/2024)   [DISCONTINUED] fluconazole  (DIFLUCAN ) 150 MG tablet Take 1 tablet (150 mg total) by mouth daily.   [DISCONTINUED] insulin  aspart protamine - aspart (NOVOLOG  70/30 MIX) (70-30) 100 UNIT/ML FlexPen Take 35 units with breakfast and take 30 units with supper everyday. (Patient not taking:  Reported on 05/18/2017)   [DISCONTINUED] metFORMIN  (GLUCOPHAGE  XR) 500 MG 24 hr tablet Take 2 tabs with breakfast and 2 tabs with supper everyday (Patient not taking: Reported on 04/10/2019)   No facility-administered encounter medications on file as of 04/24/2024.    Allergies (verified) Erythromycin and Aspirin   History: Past Medical History:  Diagnosis Date   Anxiety    Asthma    Cervical herniated disc    Chronic pain 04/15/2017   Constipation 04/15/2017   Diabetes mellitus without complication (HCC)     Glaucoma (increased eye pressure)    HTN (hypertension) 02/21/2017   Pyelonephritis 02/21/2017   Past Surgical History:  Procedure Laterality Date   BILATERAL CARPAL TUNNEL RELEASE     BLADDER SUSPENSION     NASAL SINUS SURGERY     PARTIAL HYSTERECTOMY     Family History  Problem Relation Age of Onset   Hypertension Mother    Diabetes Mother    Hypertension Father    Diabetes Father    Colon cancer Father    Social History   Socioeconomic History   Marital status: Single    Spouse name: Not on file   Number of children: Not on file   Years of education: Not on file   Highest education level: Not on file  Occupational History   Not on file  Tobacco Use   Smoking status: Every Day    Current packs/day: 0.75    Types: Cigarettes   Smokeless tobacco: Never  Vaping Use   Vaping status: Never Used  Substance and Sexual Activity   Alcohol use: Not Currently    Alcohol/week: 2.0 standard drinks of alcohol    Types: 2 Shots of liquor per week    Comment: shots    Drug use: Not Currently    Types: Cocaine   Sexual activity: Not on file  Other Topics Concern   Not on file  Social History Narrative   Not on file   Social Drivers of Health   Financial Resource Strain: Low Risk  (04/24/2024)   Overall Financial Resource Strain (CARDIA)    Difficulty of Paying Living Expenses: Not hard at all  Food Insecurity: No Food Insecurity (04/24/2024)   Hunger Vital Sign    Worried About Running Out of Food in the Last Year: Never true    Ran Out of Food in the Last Year: Never true  Transportation Needs: No Transportation Needs (04/24/2024)   PRAPARE - Administrator, Civil Service (Medical): No    Lack of Transportation (Non-Medical): No  Physical Activity: Inactive (04/24/2024)   Exercise Vital Sign    Days of Exercise per Week: 0 days    Minutes of Exercise per Session: 0 min  Stress: No Stress Concern Present (04/24/2024)   [employer] of Occupational Health -  Occupational Stress Questionnaire    Feeling of Stress : Only a little  Social Connections: Moderately Isolated (04/24/2024)   Social Connection and Isolation Panel [NHANES]    Frequency of Communication with Friends and Family: More than three times a week    Frequency of Social Gatherings with Friends and Family: More than three times a week    Attends Religious Services: More than 4 times per year    Active Member of [employer] or Organizations: No    Attends Banker Meetings: Never    Marital Status: Never married    Tobacco Counseling Ready to quit: Not Answered Counseling given: Not Answered  Clinical Intake:  Pre-visit preparation completed: Yes  Pain : 0-10 Pain Score: 8  Pain Type: Chronic pain Pain Location: Generalized Pain Descriptors / Indicators: Aching Pain Onset: More than a month ago Pain Frequency: Constant     BMI - recorded: 29.82 Nutritional Status: BMI 25 -29 Overweight Nutritional Risks: None Diabetes: Yes CBG done?: Yes (123 per pt) CBG resulted in Enter/ Edit results?: No Did pt. bring in CBG monitor from home?: No  Lab Results  Component Value Date   HGBA1C 14.5 (H) 04/16/2017   HGBA1C 11.3 (H) 02/23/2017     How often do you need to have someone help you when you read instructions, pamphlets, or other written materials from your doctor or pharmacy?: 1 - Never  Interpreter Needed?: No  Information entered by :: Lamont Pilsner, LPN   Activities of Daily Living     04/24/2024    1:33 PM  In your present state of health, do you have any difficulty performing the following activities:  Hearing? 0  Vision? 0  Difficulty concentrating or making decisions? 0  Walking or climbing stairs? 1  Comment fight through but i live upstairs  Dressing or bathing? 0  Doing errands, shopping? 0  Preparing Food and eating ? Y  Comment at times due to hand strength  Using the Toilet? N  In the past six months, have you accidently leaked  urine? N  Do you have problems with loss of bowel control? N  Managing your Medications? N  Managing your Finances? N  Housekeeping or managing your Housekeeping? N    Patient Care Team: Versa Gore, NP as PCP - General (Family Medicine)  Indicate any recent Medical Services you may have received from other than [facility] providers in the past year (date may be approximate).     Assessment:   This is a routine wellness examination for Sara Juarez.  Hearing/Vision screen Hearing Screening - Comments:: Denies any hearing issues  Vision Screening - Comments:: Wears rx glasses - up to date with routine eye exams with [facility]     Goals Addressed             This Visit's Progress    Patient Stated       Get leukemia under control       Depression Screen      04/24/2024    1:38 PM 01/18/2024    2:46 PM 09/22/2023    8:47 PM 05/18/2017   10:28 AM  PHQ 2/9 Scores  PHQ - 2 Score 1 5  0  PHQ- 9 Score  19       Information is confidential and restricted. Go to Review Flowsheets to unlock data.    Fall Risk      04/24/2024    1:41 PM 05/18/2017   10:28 AM  Fall Risk   Falls in the past year? 0 No  Number falls in past yr: 0   Injury with Fall? 0   Risk for fall due to : Impaired mobility   Follow up Falls prevention discussed     MEDICARE RISK AT HOME:   Medicare Risk at Home Any stairs in or around the home?: Yes If so, are there any without handrails?: No Home free of loose throw rugs in walkways, pet beds, electrical cords, etc?: Yes Adequate lighting in your home to reduce risk of falls?: Yes Life alert?: No Use of a cane, walker or w/c?: No Grab bars in the bathroom?: No Shower chair  or bench in shower?: Yes Elevated toilet seat or a handicapped toilet?: No  TIMED UP AND GO:  Was the test performed?  No  Cognitive Function: 6CIT completed        04/24/2024    1:43 PM  6CIT Screen  What Year? 0 points  What month? 0 points  What time? 0 points   Count back from 20 0 points  Months in reverse 0 points  Repeat phrase 0 points  Total Score 0 points    Immunizations Immunization History  Administered Date(s) Administered   Tdap 05/10/2019    Screening Tests Health Maintenance  Topic Date Due   OPHTHALMOLOGY EXAM  Never done   Diabetic kidney evaluation - Urine ACR  Never done   HEMOGLOBIN A1C  10/16/2017   FOOT EXAM  06/27/2024 (Originally 07/20/1970)   MAMMOGRAM  02/06/2025 (Originally 07/20/2010)   Colonoscopy  02/06/2025 (Originally 07/20/2005)   Hepatitis C Screening  02/06/2025 (Originally 07/20/1978)   INFLUENZA VACCINE  07/20/2024   Diabetic kidney evaluation - eGFR measurement  09/21/2024   Medicare Annual Wellness (AWV)  04/24/2025   DTaP/Tdap/Td (2 - Td or Tdap) 05/09/2029   HIV Screening  Completed   HPV VACCINES  Aged Out   Meningococcal B Vaccine  Aged Out   Pneumococcal Vaccine 55-19 Years old  Discontinued   COVID-19 Vaccine  Discontinued   Zoster Vaccines- Shingrix  Discontinued    Health Maintenance  Health Maintenance Due  Topic Date Due   OPHTHALMOLOGY EXAM  Never done   Diabetic kidney evaluation - Urine ACR  Never done   HEMOGLOBIN A1C  10/16/2017   Health Maintenance Items Addressed: See Nurse Notes  Additional Screening:  Vision Screening: Recommended annual ophthalmology exams for early detection of glaucoma and other disorders of the eye.  Dental Screening: Recommended annual dental exams for proper oral hygiene  Community Resource Referral / Chronic Care Management: CRR required this visit?  No   CCM required this visit?  No     Plan:     I have personally reviewed and noted the following in the patient's chart:   Medical and social history Use of alcohol, tobacco or illicit drugs  Current medications and supplements including opioid prescriptions. Patient is not currently taking opioid prescriptions. Functional ability and status Nutritional status Physical  activity Advanced directives List of other physicians Hospitalizations, surgeries, and ER visits in previous 12 months Vitals Screenings to include cognitive, depression, and falls Referrals and appointments  In addition, I have reviewed and discussed with patient certain preventive protocols, quality metrics, and best practice recommendations. A written personalized care plan for preventive services as well as general preventive health recommendations were provided to patient.     Bruno Capri, LPN   12/25/1094   After Visit Summary: (MyChart) Due to this being a telephonic visit, the after visit summary with patients personalized plan was offered to patient via MyChart   Notes: Nothing significant to report at this time.

## 2024-05-02 ENCOUNTER — Telehealth: Payer: Self-pay

## 2024-05-02 NOTE — Telephone Encounter (Signed)
 Copied from CRM [id number]. Topic: General - Other >> May 02, 2024 11:36 AM Magdalene [employer] wrote: Reason for CRM: Margretta Shi calling from [facility] stating that patient is wanting to schedule an appointment to get her leukemia under control but Margretta Shi is not seeing any leukemia diagnosis in any of her records and would like a call back to discuss the patient's status. Call back number [phone]

## 2024-05-03 ENCOUNTER — Encounter: Payer: Self-pay | Admitting: Family

## 2024-05-03 ENCOUNTER — Telehealth (INDEPENDENT_AMBULATORY_CARE_PROVIDER_SITE_OTHER): Admitting: Family

## 2024-05-03 DIAGNOSIS — J309 Allergic rhinitis, unspecified: Secondary | ICD-10-CM

## 2024-05-03 DIAGNOSIS — F4312 Post-traumatic stress disorder, chronic: Secondary | ICD-10-CM

## 2024-05-03 DIAGNOSIS — D45 Polycythemia vera: Secondary | ICD-10-CM

## 2024-05-03 DIAGNOSIS — F339 Major depressive disorder, recurrent, unspecified: Secondary | ICD-10-CM

## 2024-05-03 DIAGNOSIS — E1169 Type 2 diabetes mellitus with other specified complication: Secondary | ICD-10-CM | POA: Diagnosis not present

## 2024-05-03 DIAGNOSIS — Z7985 Long-term (current) use of injectable non-insulin antidiabetic drugs: Secondary | ICD-10-CM

## 2024-05-03 DIAGNOSIS — Z794 Long term (current) use of insulin: Secondary | ICD-10-CM

## 2024-05-03 DIAGNOSIS — I152 Hypertension secondary to endocrine disorders: Secondary | ICD-10-CM

## 2024-05-03 DIAGNOSIS — E1159 Type 2 diabetes mellitus with other circulatory complications: Secondary | ICD-10-CM

## 2024-05-03 MED ORDER — FLUTICASONE PROPIONATE 50 MCG/ACT NA SUSP: 1.0000 | Freq: Two times a day (BID) | NASAL | 5 refills | Status: DC

## 2024-05-03 NOTE — Telephone Encounter (Signed)
 no leukemia in her chart. 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 I would call patient and ask why she was calling [facility]? is that where she was treated before? let me know, thanks.

## 2024-05-03 NOTE — Telephone Encounter (Signed)
 I returned call from [facility]. I gave nurse Dr. Mason Sole information at [facility] to get records for dx. Pt contacted [facility] for a second opinion.

## 2024-05-03 NOTE — Progress Notes (Unsigned)
 MyChart Video Visit    Virtual Visit via Video Note   This format is felt to be most appropriate for this patient at this time. Physical exam was limited by quality of the video and audio technology used for the visit. CMA was able to get the patient set up on a video visit.  Patient location: Home. Patient and provider in visit Provider location: Office  I discussed the limitations of evaluation and management by telemedicine and the availability of in person appointments. The patient expressed understanding and agreed to proceed.  Visit Date: 05/03/2024  Today's healthcare provider: Versa Gore, NP     Subjective:   Patient ID: Sara Juarez, female    DOB: March 26, 1961, 63 y.o.   MRN: [id number]  Chief Complaint  Patient presents with   Anxiety  Discussed the use of AI scribe software for clinical note transcription with the patient, who gave verbal consent to proceed.  History of Present Illness Sara Juarez is a 63 year old female who presents with concerns about elevated red blood cell count and depression.  She is concerned about an elevated red blood cell count, initially misdiagnosed as leukemia but later identified as polycythemia. Her last elevated count was in October of the previous year, with a similar occurrence five years ago. She has not had recent blood work to evaluate her current status.  She experiences depression and anxiety. Hydroxyzine  provided little relief. She attempted to seek help from a behavioral health facility but left due to discomfort with the staff. She requires medication management and therapy for her mental health.  She has chronic sinusitis, managed with fluticasone  nasal spray, two sprays in each nostril daily. She uses Sudafed occasionally for congestion but limits its use due to stimulant effects. She underwent sinus surgery, which provided relief for five years before symptoms recurred.  She manages diabetes with  Mounjaro and reports a previous hemoglobin A1c of 5.9%. She has eliminated sugar from her diet and monitors her carbohydrate intake.  Assessment & Plan Polycythemia Mild polycythemia with outdated labs. Differential includes polycythemia vera and leukemia. She requested referral outside [facility] due to past negative experiences. - Order current blood work to assess red blood cell count. Request [facility] health medical records again. - Refer to hematology at [facility] if blood work shows elevated levels if agreeable. - Consider alternative referral options if she remains uncomfortable with [facility].  Depression and anxiety Ongoing depression and anxiety with inadequate response to hydroxyzine . Negative experience with behavioral health services increased anxiety and reluctance to engage in care. Pt tearful again during visit. Refused appt when [facility] called, she states she did not understand what appointment was for at the time. - Send new referral to psychiatry for evaluation, therapy referral and potential medication management. - Encourage engagement in therapy for supportive listening and coping strategies. - Ensure contact information is up to date for follow-up communication. - Advise her to clear voicemail to facilitate communication. - Will continue to follow  Type 2 diabetes mellitus Type 2 diabetes mellitus with previous HbA1c of 5.9, indicating good control. On Mounjaro for management. Current labs needed to assess ongoing control. - Order metabolic panel & W0J to assess current diabetes control. - Advise fasting before lab work if possible, avoiding sugar and carbs if fasting is not feasible. - F/U in 4 mos  Chronic sinusitis  Chronic sinusitis managed with fluticasone  nasal spray. Issues with pharmacy refills and occasional use of  Sudafed for symptom relief. - Send refill for fluticasone  nasal spray with refills. - Advise use of one spray per nostril twice daily,  increasing to two sprays if needed for symptom control. - Caution against regular use of Sudafed due to potential stimulant effects, only use prn. - F/U in 6 mos    Assessment & Plan:  There are no diagnoses linked to this encounter.  Past Medical History:  Diagnosis Date   Anxiety    Asthma    Cervical herniated disc    Chronic pain 04/15/2017   Constipation 04/15/2017   Diabetes mellitus without complication (HCC)    Glaucoma (increased eye pressure)    HTN (hypertension) 02/21/2017   Pyelonephritis 02/21/2017    Past Surgical History:  Procedure Laterality Date   BILATERAL CARPAL TUNNEL RELEASE     BLADDER SUSPENSION     NASAL SINUS SURGERY     PARTIAL HYSTERECTOMY      Outpatient Medications Prior to Visit  Medication Sig Dispense Refill   aspirin EC 81 MG tablet Take 325 mg by mouth daily.     Continuous Glucose Receiver (DEXCOM G7 RECEIVER) DEVI Please provide 1 dexcom g7 receiver 1 each 1   Continuous Glucose Sensor (DEXCOM G7 SENSOR) MISC Apply sensor every 10 days. Pharmacy please provide 3 boxes (1 sensor per week) 3 each 11   cyclobenzaprine  (FLEXERIL ) 10 MG tablet Take 10 mg by mouth 2 (two) times daily as needed.     cycloSPORINE (RESTASIS) 0.05 % ophthalmic emulsion Apply to eye.     empagliflozin  (JARDIANCE ) 10 MG TABS tablet Take 1 tablet (10 mg total) by mouth daily. 30 tablet 5   fluticasone  (FLONASE ) 50 MCG/ACT nasal spray Place 1 spray into both nostrils 2 (two) times daily. 18.2 mL 5   Insulin  Lispro Prot & Lispro (HUMALOG 75/25 MIX) (75-25) 100 UNIT/ML Kwikpen Inject into the skin as needed (When CBG elevated).     lisinopril  (ZESTRIL ) 30 MG tablet Take 1 tablet (30 mg total) by mouth every morning. 30 tablet 5   LUMIGAN 0.01 % SOLN Place 1 drop into both eyes every evening.     MOUNJARO 5 MG/0.5ML Pen Inject 5 mg into the skin once a week.     omeprazole (PRILOSEC) 20 MG capsule Take 20 mg by mouth 3 (three) times daily.     ONETOUCH ULTRA test strip  Two times daily as instructed. 100 each 2   triamcinolone  cream (KENALOG ) 0.1 % APPLY 1 APPLICATION TOPICALLY DAILY AS NEEDED (ON AFFECTED AREAS(S) ON SKIN). 30 g 0   No facility-administered medications prior to visit.    Allergies  Allergen Reactions   Erythromycin Other (See Comments)    Other reaction(s): CRAMPS   Aspirin Itching and Rash    Other reaction(s): RASH       Objective:   Physical Exam Vitals and nursing note reviewed.  Constitutional:      General: Pt is not in acute distress.    Appearance: Normal appearance.  HENT:     Head: Normocephalic.  Pulmonary:     Effort: No respiratory distress.  Musculoskeletal:     Cervical back: Normal range of motion.  Skin:    General: Skin is dry.     Coloration: Skin is not pale.  Neurological:     Mental Status: Pt is alert and oriented to person, place, and time.  Psychiatric:        Mood and Affect: Mood normal.   There were no vitals taken  for this visit.  Wt Readings from Last 3 Encounters:  04/24/24 171 lb (77.6 kg)  01/18/24 171 lb 6.4 oz (77.7 kg)  08/26/23 199 lb 15.3 oz (90.7 kg)      I discussed the assessment and treatment plan with the patient. The patient was provided an opportunity to ask questions and all were answered. The patient agreed with the plan and demonstrated an understanding of the instructions.   The patient was advised to call back or seek an in-person evaluation if the symptoms worsen or if the condition fails to improve as anticipated.  Versa Gore, NP [facility] [phone] (phone) [phone] (fax)  [facility]

## 2024-05-04 MED ORDER — EMPAGLIFLOZIN 10 MG PO TABS
10.0000 mg | ORAL_TABLET | Freq: Every day | ORAL | 5 refills | Status: DC
Start: 2024-05-04 — End: 2024-09-07

## 2024-05-04 NOTE — Assessment & Plan Note (Signed)
 Type 2 diabetes mellitus with previous HbA1c of 5.9, indicating good control. On Mounjaro for management. No labs in chart. Current labs needed to assess ongoing control. - Sending refill for Jardiance  25mg  qd. - Order metabolic panel & V4U to assess current diabetes control. Advised to call back to schedule lab work. - Advise fasting before lab work if possible, avoiding sugar and carbs if fasting is not feasible. - F/U in 4 mos

## 2024-05-04 NOTE — Assessment & Plan Note (Signed)
 Mild polycythemia with outdated labs. Differential includes polycythemia vera and leukemia. She requested referral outside [facility] due to past negative experiences. - Order current blood work to assess blood cell count. Advised to call back to schedule a non-fasting lab only appt.  - Request [facility] health medical records again. - Refer to hematology at [facility] if blood work shows elevated levels if agreeable. - Consider alternative referral options if she remains uncomfortable with [facility].

## 2024-05-04 NOTE — Assessment & Plan Note (Signed)
 Ongoing depression and anxiety with inadequate response to hydroxyzine . Negative experience with behavioral health services increased anxiety and reluctance to engage in care. Pt tearful again during visit. Refused appt when [facility] called, she states she did not understand what appointment was for at the time. - Send new referral to psychiatry for evaluation, therapy referral and potential medication management. - Encourage engagement in therapy for supportive listening and coping strategies. - Ensure contact information is up to date for follow-up communication. - Advised to clear voicemail to facilitate communication. - Will continue to follow

## 2024-05-04 NOTE — Assessment & Plan Note (Signed)
 Chronic sinusitis managed with fluticasone  nasal spray. Issues with pharmacy refills and occasional use of Sudafed for symptom relief. - Send refill for fluticasone  nasal spray with refills. - Advise use of one spray per nostril twice daily, increasing to two sprays if needed for symptom control. - Caution against regular use of Sudafed due to potential stimulant effects, only use prn. - F/U in 6 mos

## 2024-05-10 ENCOUNTER — Other Ambulatory Visit: Payer: Self-pay | Admitting: Family

## 2024-05-10 DIAGNOSIS — E1169 Type 2 diabetes mellitus with other specified complication: Secondary | ICD-10-CM

## 2024-05-13 ENCOUNTER — Other Ambulatory Visit: Payer: Self-pay | Admitting: Family

## 2024-05-21 ENCOUNTER — Telehealth: Payer: Self-pay | Admitting: Family

## 2024-05-21 ENCOUNTER — Ambulatory Visit: Payer: Self-pay

## 2024-05-21 MED ORDER — MOUNJARO 5 MG/0.5ML ~~LOC~~ SOAJ: 5.0000 mg | SUBCUTANEOUS | 1 refills | Status: DC

## 2024-05-21 NOTE — Telephone Encounter (Signed)
 lab work entered for future - did she schedule an appt?

## 2024-05-21 NOTE — Telephone Encounter (Signed)
 RX sent

## 2024-05-21 NOTE — Telephone Encounter (Signed)
 Prescription Request  05/21/2024  LOV: 01/18/2024  What is the name of the medication or equipment?  MOUNJARO 5 MG/0.5ML Pen   Have you contacted your pharmacy to request a refill? Yes   Which pharmacy would you like this sent to?  [id number] [name],  - [address] Phone: [phone]  Fax: [phone]      Patient notified that their request is being sent to the clinical staff for review and that they should receive a response within 2 business days.   Please advise at Mobile [phone] (mobile)

## 2024-05-21 NOTE — Telephone Encounter (Unsigned)
 Copied from CRM [id number]. Topic: Clinical - Medication Refill >> May 21, 2024  3:23 PM Valeri Gate H wrote: Medication: MOUNJARO 5 MG/0.5ML Pen  Has the patient contacted their pharmacy? Yes (Agent: If no, request that the patient contact the pharmacy for the refill. If patient does not wish to contact the pharmacy document the reason why and proceed with request.) (Agent: If yes, when and what did the pharmacy advise?) pharmacy advised patient there are no refills  This is the patient's preferred pharmacy:  [name] [place] [facility] [address] [place] [address] Phone: [phone]  Is this the correct pharmacy for this prescription? Yes If no, delete pharmacy and type the correct one.   Has the prescription been filled recently? No  Is the patient out of the medication? Yes  Has the patient been seen for an appointment in the last year OR does the patient have an upcoming appointment? Yes  Can we respond through MyChart? Yes  Agent: Please be advised that Rx refills may take up to 3 business days. We ask that you follow-up with your pharmacy.

## 2024-05-21 NOTE — Telephone Encounter (Signed)
 Copied from CRM [id number]. Topic: Clinical - Red Word Triage >> May 21, 2024  3:29 PM Sara Juarez wrote: Red Word that prompted transfer to Nurse Triage: Patient has swelling in right hand since Saturday, when asked about pain, she said yes and that she's always in pain. Says hand is really throbbing.    Chief Complaint: Right Hand Swelling Symptoms: swelling Frequency: ------ Pertinent Negatives: Patient denies fever, blurry vision, difficulty breathing. Disposition: [] ED /[] Urgent Care (no appt availability in office) / [] Appointment(In office/virtual)/ []  [facility]/ [] Home Care/ [] Refused Recommended Disposition /[] [facility]/ []  Follow-up with PCP Additional Notes: Patient called and was transferred to this RN for right hand swelling. Upon further questioning from this RN: She states that the refills for Mounjaro  She states that when her hand gets like that she knows her blood sugar is high. She states her blood sugar was 130 over the weekend one day but she hasn't checked her blood sugar today. Patient denies any other symptoms. Patient states that she does not need to see her doctor for her hand because she knows what is going on with that. She states that she needed to have some labwork and that she was having issues with her Mounjaro. Patient is very frustrated at this point.    This RN attempted to assist her but it wasn't clear what the patient wanted at this time. This RN called the CAL for further assistance. The CAL spoke to the patient and assisted her with her questions/concerns.   Reason for Disposition  MODERATE hand swelling (e.g., visible swelling of hand and fingers; pitting edema)  Answer Assessment - Initial Assessment Questions 1. ONSET: "When did the swelling start?" (e.g., minutes, hours, days)     2 days ago 2. LOCATION: "What part of the hand is swollen?"  "Are both hands swollen or just one hand?"     Entire right hand 3. SEVERITY:  "How bad is the swelling?" (e.g., localized; mild, moderate, severe)   - BALL OR LUMP: small ball or lump   - LOCALIZED: puffy or swollen area or patch of skin   - JOINT SWELLING: swelling of a joint   - MILD: puffiness or mild swelling of fingers or hand   - MODERATE: fingers and hand are swollen   - SEVERE: swelling of entire hand and up into forearm     Severe swelling 4. REDNESS: "Does the swelling look red or infected?"     No 5. PAIN: "Is the swelling painful to touch?" If Yes, ask: "How painful is it?"   (Scale 1-10; mild, moderate or severe)     Patient states very painful 6. FEVER: "Do you have a fever?" If Yes, ask: "What is it, how was it measured, and when did it start?"      No 7. CAUSE: "What do you think is causing the hand swelling?" (e.g., heat, insect bite, pregnancy, recent injury)     Patient states missing her Mounjaro medication 8. MEDICAL HISTORY: "Do you have a history of heart failure, kidney disease, liver failure, or cancer?"     --- 9. RECURRENT SYMPTOM: "Have you had hand swelling before?" If Yes, ask: "When was the last time?" "What happened that time?"     Patient states her sugar will be high 10. OTHER SYMPTOMS: "Do you have any other symptoms?" (e.g., blurred vision, difficulty breathing, headache)       Denies  Protocols used: Hand Swelling-A-AH

## 2024-05-25 ENCOUNTER — Other Ambulatory Visit

## 2024-05-30 ENCOUNTER — Ambulatory Visit: Payer: Self-pay

## 2024-05-30 ENCOUNTER — Ambulatory Visit: Admitting: Family

## 2024-05-30 NOTE — Telephone Encounter (Signed)
 Appt today

## 2024-05-30 NOTE — Telephone Encounter (Signed)
 FYI Only or Action Required?: FYI only for provider  Patient was last seen in primary care on 05/03/2024 by Versa Gore, NP. Called Nurse Triage reporting Right Hand Swelling/Pain. Symptoms began a while ago per patient. Interventions attempted: Rest, hydration, or home remedies. Symptoms are: rapidly worsening.  Triage Disposition: See HCP Within 4 Hours (Or PCP Triage)  Patient/caregiver understands and will follow disposition?: Yes                Appointment made for today 05/30/2024 at 1:30 PM with patient's PCP Versa Gore NP. Offered patient an earlier appointment but patient has a dentist appointment this morning         Copied from CRM [id number]. Topic: Clinical - Red Word Triage >> May 30, 2024  8:21 AM Allyne Areola wrote: Red Word that prompted transfer to Nurse Triage: Patient is calling due to right hand swelling and severe pain. She has been experiencing this for about two weeks and does not know what's causing it. Reason for Disposition  [1] SEVERE pain (e.g., excruciating, unable to use hand at all) AND [2] not improved after 2 hours of pain medicine  Answer Assessment - Initial Assessment Questions 1. ONSET: When did the swelling start? (e.g., minutes, hours, days)     Quite a while 2. LOCATION: What part of the hand is swollen?  Are both hands swollen or just one hand?     Entire right hand 3. SEVERITY: How bad is the swelling? (e.g., localized; mild, moderate, severe)   - BALL OR LUMP: small ball or lump   - LOCALIZED: puffy or swollen area or patch of skin   - JOINT SWELLING: swelling of a joint   - MILD: puffiness or mild swelling of fingers or hand   - MODERATE: fingers and hand are swollen   - SEVERE: swelling of entire hand and up into forearm     Severe---patient crying during triage 4. REDNESS: Does the swelling look red or infected?     ----- 5. PAIN: Is the swelling painful to touch? If Yes, ask: How painful is it?    (Scale 1-10; mild, moderate or severe)     I'm in pain all the time  6. FEVER: Do you have a fever? If Yes, ask: What is it, how was it measured, and when did it start?      --- 7. CAUSE: What do you think is causing the hand swelling? (e.g., heat, insect bite, pregnancy, recent injury)     ---- 8. MEDICAL HISTORY: Do you have a history of heart failure, kidney disease, liver failure, or cancer?     ---- 9. RECURRENT SYMPTOM: Have you had hand swelling before? If Yes, ask: When was the last time? What happened that time?     Injury a very long time ago 10. OTHER SYMPTOMS: Do you have any other symptoms? (e.g., blurred vision, difficulty breathing, headache)       ---   Patient has an appointment today at 10:30 AM with a dentist today for a cracked tooth that is causing pain that she would like to keep. Patient has been under a lot of stress lately  Answer Assessment - Initial Assessment Questions 1. ONSET: When did the pain start?     A while ago 2. LOCATION: Where is the pain located?     Right hand 3. PAIN: How bad is the pain? (Scale 1-10; or mild, moderate, severe)   - MILD (1-3): doesn't interfere with normal activities   -  MODERATE (4-7): interferes with normal activities (e.g., work or school) or awakens from sleep   - SEVERE (8-10): excruciating pain, unable to use hand at all     Severe---patient upset and crying during triage 4. WORK OR EXERCISE: Has there been any recent work or exercise that involved this part (i.e., hand or wrist) of the body?     Daily use 5. CAUSE: What do you think is causing the pain?     ----- 6. AGGRAVATING FACTORS: What makes the pain worse? (e.g., using computer)     Any use of this hand 7. OTHER SYMPTOMS: Do you have any other symptoms? (e.g., neck pain, swelling, rash, numbness, fever)     swelling  Protocols used: Hand Swelling-A-AH, Hand and Wrist Pain-A-AH

## 2024-06-08 ENCOUNTER — Telehealth: Payer: Self-pay | Admitting: *Deleted

## 2024-06-08 MED ORDER — MOUNJARO 5 MG/0.5ML ~~LOC~~ SOAJ: 5.0000 mg | SUBCUTANEOUS | 0 refills | Status: DC

## 2024-06-08 NOTE — Telephone Encounter (Signed)
 Copied from CRM [id number]. Topic: Clinical - Medication Question >> Jun 08, 2024  9:38 AM Sara Juarez wrote: Reason for CRM: Pt states this should be a 90-day supply but [facility] [address] and [facility] was only a 28-day supply and it should be a 90-day supply  This is the correct pharmacy [name], [place] - [address] [place] [address] Phone: [phone] Fax: [phone] Hours: Not open 24 hours  MOUNJARO  5 MG/0.5ML Pen  She states she also needs the [facility] ULTRA test strip  Patient states the [facility] [address] [facility] very [facility] and rude to her and does NOT treat her well at all.

## 2024-06-20 ENCOUNTER — Encounter (HOSPITAL_COMMUNITY): Payer: Self-pay

## 2024-06-20 ENCOUNTER — Ambulatory Visit (HOSPITAL_COMMUNITY): Admission: EM | Admit: 2024-06-20 | Discharge: 2024-06-20 | Disposition: A | Discharge status: Home / Self Care

## 2024-06-20 DIAGNOSIS — J029 Acute pharyngitis, unspecified: Secondary | ICD-10-CM

## 2024-06-20 DIAGNOSIS — H6692 Otitis media, unspecified, left ear: Secondary | ICD-10-CM | POA: Diagnosis not present

## 2024-06-20 MED ORDER — HYDROCODONE-ACETAMINOPHEN 5-325 MG PO TABS
1.0000 | ORAL_TABLET | ORAL | 0 refills | Status: AC | PRN
Start: 2024-06-20 — End: 2025-06-20

## 2024-06-20 MED ORDER — AMOXICILLIN 500 MG PO CAPS: 500.0000 mg | ORAL_CAPSULE | Freq: Three times a day (TID) | ORAL | 0 refills | Status: DC

## 2024-06-20 NOTE — ED Triage Notes (Signed)
 Patient here today with c/o left ear pain, ST, headache, nasal congestion, cough, wheeze, and body aches X 1 day. No known sick contacts.

## 2024-06-20 NOTE — ED Provider Notes (Signed)
 [facility]    CSN: [id number] Arrival date & time: 06/20/24  1400      History   Chief Complaint Chief Complaint  Patient presents with   Sore Throat    HPI Sara Juarez is a 62 y.o. female.   Patient complains of a sore throat a swollen area on the left side of her neck and a left-sided earache.  Patient reports that she has been having chills and feels like she has had a fever.  She denies exposure to anyone with any illness.  Patient complains of pain with swallowing.  Patient denies any nausea vomiting or diarrhea.  Patient tells me she has a past medical history of hypertension and diabetes.  Patient reports she has a history of leukemia.  Patient states she is not currently in any treatment.  The history is provided by the patient. A language interpreter was used.  Sore Throat    Past Medical History:  Diagnosis Date   Anxiety    Asthma    Cervical herniated disc    Chronic pain 04/15/2017   Constipation 04/15/2017   Diabetes mellitus without complication (HCC)    Glaucoma (increased eye pressure)    HTN (hypertension) 02/21/2017   Pyelonephritis 02/21/2017    Patient Active Problem List   Diagnosis Date Noted   Chronic post-traumatic stress disorder (PTSD) 02/09/2024   Chronic allergic rhinitis 01/18/2024   Eczema 01/18/2024   Depression, recurrent (HCC) 01/18/2024   Polycythemia vera (HCC) 01/18/2024   Polysubstance abuse (HCC) 09/22/2023   DM2 (diabetes mellitus, type 2) (HCC) 02/21/2017   Hypertension associated with type 2 diabetes mellitus (HCC) 02/21/2017    Past Surgical History:  Procedure Laterality Date   BILATERAL CARPAL TUNNEL RELEASE     BLADDER SUSPENSION     NASAL SINUS SURGERY     PARTIAL HYSTERECTOMY      OB History   No obstetric history on file.      Home Medications    Prior to Admission medications   Medication Sig Start Date End Date Taking? Authorizing Provider  amoxicillin  (AMOXIL ) 500 MG capsule  Take 1 capsule (500 mg total) by mouth 3 (three) times daily. 06/20/24  Yes Marilynn Ekstein K, PA-C  HYDROcodone -acetaminophen  (NORCO/VICODIN) 5-325 MG tablet Take 1 tablet by mouth every 4 (four) hours as needed. 06/20/24 06/20/25 Yes Satina Jerrell K, PA-C  traZODone (DESYREL) 100 MG tablet Take 100 mg by mouth at bedtime. 05/02/24  Yes [provider]  aspirin EC 81 MG tablet Take 325 mg by mouth daily. 01/27/21   [provider]  Continuous Glucose Receiver (DEXCOM G7 RECEIVER) DEVI Please provide 1 dexcom g7 receiver 03/01/24   Lucius Krabbe, NP  Continuous Glucose Sensor (DEXCOM G7 SENSOR) MISC Apply sensor every 10 days. Pharmacy please provide 3 boxes (1 sensor per week) 01/18/24   Lucius Krabbe, NP  cyclobenzaprine  (FLEXERIL ) 10 MG tablet Take 10 mg by mouth 2 (two) times daily as needed. 09/28/23   [provider]  cycloSPORINE (RESTASIS) 0.05 % ophthalmic emulsion Apply to eye. 01/26/18   [provider]  empagliflozin  (JARDIANCE ) 10 MG TABS tablet Take 1 tablet (10 mg total) by mouth daily. 05/04/24   Lucius Krabbe, NP  fluticasone  (FLONASE ) 50 MCG/ACT nasal spray Place 1 spray into both nostrils 2 (two) times daily. 05/03/24   Lucius Krabbe, NP  Insulin  Lispro Prot & Lispro (HUMALOG 75/25 MIX) (75-25) 100 UNIT/ML Kwikpen Inject into the skin as needed (When CBG elevated). 01/27/21  [provider]  lisinopril  (ZESTRIL ) 30 MG tablet Take 1 tablet (30 mg total) by mouth every morning. 02/07/24   Hudnell, Corean, NP  LUMIGAN 0.01 % SOLN Place 1 drop into both eyes every evening. 06/04/16   [provider]  MOUNJARO  5 MG/0.5ML Pen Inject 5 mg into the skin once a week. 06/08/24   Lucius Corean, NP  omeprazole (PRILOSEC) 20 MG capsule Take 20 mg by mouth 3 (three) times daily. 07/08/16   [provider]  ONETOUCH ULTRA test strip USE TO TEST BLOOD SUGAR TWICE A DAY 05/11/24   Hudnell, Corean, NP  triamcinolone  cream  (KENALOG ) 0.1 % APPLY 1 APPLICATION TOPICALLY DAILY AS NEEDED (ON AFFECTED AREAS(S) ON SKIN). 02/28/24   Lucius Corean, NP  insulin  aspart protamine - aspart (NOVOLOG  70/30 MIX) (70-30) 100 UNIT/ML FlexPen Take 35 units with breakfast and take 30 units with supper everyday. Patient not taking: Reported on 05/18/2017 04/17/17 02/19/20  Vicci Afton CROME, MD  metFORMIN  (GLUCOPHAGE  XR) 500 MG 24 hr tablet Take 2 tabs with breakfast and 2 tabs with supper everyday Patient not taking: Reported on 04/10/2019 04/17/17 02/19/20  Vicci Afton CROME, MD    Family History Family History  Problem Relation Age of Onset   Hypertension Mother    Diabetes Mother    Hypertension Father    Diabetes Father    Colon cancer Father     Social History Social History   Tobacco Use   Smoking status: Every Day    Current packs/day: 0.75    Types: Cigarettes   Smokeless tobacco: Never  Vaping Use   Vaping status: Never Used  Substance Use Topics   Alcohol use: Yes    Alcohol/week: 2.0 standard drinks of alcohol    Types: 2 Shots of liquor per week    Comment: shots    Drug use: Yes    Types: Cocaine, Marijuana    Comment: Past Cocaine, current Marijuana     Allergies   Erythromycin and Aspirin   Review of Systems Review of Systems  HENT:  Positive for ear pain, sinus pain and sore throat.   All other systems reviewed and are negative.    Physical Exam Triage Vital Signs ED Triage Vitals [06/20/24 1446]  Encounter Vitals Group     BP 130/78     Girls Systolic BP Percentile      Girls Diastolic BP Percentile      Boys Systolic BP Percentile      Boys Diastolic BP Percentile      Pulse Rate 88     Resp 16     Temp 98.9 F (37.2 C)     Temp Source Oral     SpO2 92 %     Weight      Height      Head Circumference      Peak Flow      Pain Score 8     Pain Loc      Pain Education      Exclude from Growth Chart    No data found.  Updated Vital Signs BP 130/78 (BP Location:  Right Arm)   Pulse 88   Temp 98.9 F (37.2 C) (Oral)   Resp 16   SpO2 92%   Visual Acuity Right Eye Distance:   Left Eye Distance:   Bilateral Distance:    Right Eye Near:   Left Eye Near:    Bilateral Near:     Physical Exam Vitals and  nursing note reviewed.  Constitutional:      Appearance: She is well-developed.  HENT:     Head: Normocephalic.     Left Ear: Tympanic membrane is erythematous.     Mouth/Throat:     Pharynx: Pharyngeal swelling and posterior oropharyngeal erythema present.  Cardiovascular:     Rate and Rhythm: Normal rate.  Pulmonary:     Effort: Pulmonary effort is normal.  Abdominal:     General: There is no distension.  Musculoskeletal:        General: Normal range of motion.     Cervical back: Normal range of motion.  Neurological:     Mental Status: She is alert and oriented to person, place, and time.      UC Treatments / Results  Labs (all labs ordered are listed, but only abnormal results are displayed) Labs Reviewed - No data to display  EKG   Radiology No results found.  Procedures Procedures (including critical care time)  Medications Ordered in UC Medications - No data to display  Initial Impression / Assessment and Plan / UC Course  I have reviewed the triage vital signs and the nursing notes.  Pertinent labs & imaging results that were available during my care of the patient were reviewed by me and considered in my medical decision making (see chart for details).     Patient given a prescription for antibiotics.  Patient request medication for pain.  Patient is advised to recheck with her primary care provider in 3 to 4 days. Final Clinical Impressions(s) / UC Diagnoses   Final diagnoses:  Pharyngitis, unspecified etiology  Left otitis media, unspecified otitis media type   Discharge Instructions   None    ED Prescriptions     Medication Sig Dispense Auth. Provider   amoxicillin  (AMOXIL ) 500 MG capsule Take 1  capsule (500 mg total) by mouth 3 (three) times daily. 30 capsule Tenoch Mcclure K, PA-C   HYDROcodone -acetaminophen  (NORCO/VICODIN) 5-325 MG tablet Take 1 tablet by mouth every 4 (four) hours as needed. 12 tablet Charlisa Cham K, PA-C     An After Visit Summary was printed and given to the patient.     I have reviewed the PDMP during this encounter.   Flint Sonny POUR, PA-C 06/20/24 1605

## 2024-07-31 ENCOUNTER — Telehealth: Payer: Self-pay

## 2024-07-31 ENCOUNTER — Other Ambulatory Visit: Payer: Self-pay | Admitting: Family

## 2024-07-31 DIAGNOSIS — B3731 Acute candidiasis of vulva and vagina: Secondary | ICD-10-CM

## 2024-07-31 MED ORDER — FLUCONAZOLE 150 MG PO TABS: ORAL_TABLET | ORAL | 0 refills | Status: DC

## 2024-07-31 NOTE — Telephone Encounter (Signed)
 Copied from CRM [id number]. Topic: Clinical - Medication Refill >> Jul 31, 2024 10:17 AM Sara Juarez wrote: Medication: HYDROcodone -acetaminophen  (NORCO/VICODIN) 5-325 MG tablet  Has the patient contacted their pharmacy? Yes (Agent: If no, request that the patient contact the pharmacy for the refill. If patient does not wish to contact the pharmacy document the reason why and proceed with request.) (Agent: If yes, when and what did the pharmacy advise?)  This is the patient's preferred pharmacy:  [phone] [place] - [address] [place] [address] Phone: [phone] Fax: [phone]  Is this the correct pharmacy for this prescription? Yes If no, delete pharmacy and type the correct one.   Has the prescription been filled recently? No  Is the patient out of the medication? Yes  Has the patient been seen for an appointment in the last year OR does the patient have an upcoming appointment? Yes  Can we respond through MyChart? No, please call if needed.  Agent: Please be advised that Rx refills may take up to 3 business days. We ask that you follow-up with your pharmacy.

## 2024-07-31 NOTE — Telephone Encounter (Signed)
 RX sent

## 2024-07-31 NOTE — Addendum Note (Signed)
 Addended by: Calynn Ferrero on: 07/31/2024 07:48 PM   Modules accepted: Orders

## 2024-07-31 NOTE — Telephone Encounter (Signed)
 Copied from CRM [id number]. Topic: Clinical - Medication Question >> Jul 31, 2024 10:15 AM Sara Juarez wrote: Reason for CRM: Patient was seen at urgent care for sinus infection and was given antibiotics but now has a yeast infection. Wants to know if we can call her in something for this please. Would like to know if we can call in 2 to make sure the yeast infection is gone. This is the pharmacy she would like to use please  [id number], [place] - [address] [place] [address] Phone: [phone] Fax: [phone] Hours: Not open 24 hours   Please advise

## 2024-07-31 NOTE — Telephone Encounter (Signed)
 Not refilling - this was given for sore throat pain a month ago, should not longer need a narcotic for pain relief. Can take OTC Ibuprofen 3-4 tabs tid prn. Thx

## 2024-08-01 ENCOUNTER — Telehealth: Payer: Self-pay

## 2024-08-01 NOTE — Telephone Encounter (Signed)
 I called pt in regards to message from PCP. Unable to leave vm due to vm box being full.

## 2024-08-01 NOTE — Telephone Encounter (Addendum)
 Copied from CRM [id number]. Topic: Clinical - Medication Question >> Jul 31, 2024  3:36 PM Aisha D wrote: Reason for CRM:  Patient was seen at urgent care for sinus infection and was given antibiotics but now has a yeast infection. Wants to know if we can call her in something for this please. Would like to know if we can call in 2 to make sure the yeast infection is gone. [facility] is [id number] [place] - [address] [place] [address]Eynwz: [phone] Fax: [phone]: Not open 24 hours  UPDATE: Pt is calling back to check the status of this request. Pt stated that this is an urgent request and she also needs to have her medication refilled for the  HYDROcodone -acetaminophen  (NORCO/VICODIN) 5-325 MG tablet. Pt stated that she would like for medications to be approved today and would also like to receive a callback today. Pt also stated that [employer] has been trying to reach out to the office but hasn't received a response back.   I returned pt's call, I let pt know Rx Diflucan  was sent into pharmacy yesterday. Pt states she did not receive a refill of HYDROcodone -acetaminophen  last month, so she went to UC on 7/20 to get a 12 day supply. Pt states she is taking this medication daily. Pt was very upset and stated If I was a white woman, Corean would have been filled my medication. I let pt know I will send a message to [place].

## 2024-08-02 NOTE — Telephone Encounter (Signed)
 I am sorry she feels that way, but Hydrocodone  is a narcotic and requires an office visit for refill. She most likely needs an orthopedic referral also if her pain is bad enough to require narcotics. She can call an orthopedic office of her choosing to schedule, but if they require a referral she needs to be seen in the office. Thx

## 2024-08-06 DIAGNOSIS — E119 Type 2 diabetes mellitus without complications: Secondary | ICD-10-CM | POA: Diagnosis not present

## 2024-08-06 DIAGNOSIS — H401130 Primary open-angle glaucoma, bilateral, stage unspecified: Secondary | ICD-10-CM | POA: Diagnosis not present

## 2024-08-06 LAB — HM DIABETES EYE EXAM

## 2024-09-07 ENCOUNTER — Other Ambulatory Visit: Payer: Self-pay | Admitting: Family

## 2024-09-07 ENCOUNTER — Ambulatory Visit: Payer: Self-pay

## 2024-09-07 DIAGNOSIS — I152 Hypertension secondary to endocrine disorders: Secondary | ICD-10-CM

## 2024-09-07 DIAGNOSIS — J309 Allergic rhinitis, unspecified: Secondary | ICD-10-CM

## 2024-09-07 DIAGNOSIS — E1169 Type 2 diabetes mellitus with other specified complication: Secondary | ICD-10-CM

## 2024-09-07 MED ORDER — FLUTICASONE PROPIONATE 50 MCG/ACT NA SUSP: 1.0000 | Freq: Two times a day (BID) | NASAL | 5 refills | Status: AC

## 2024-09-07 MED ORDER — EMPAGLIFLOZIN 10 MG PO TABS: 10.0000 mg | ORAL_TABLET | Freq: Every day | ORAL | 5 refills | Status: AC

## 2024-09-07 MED ORDER — OMEPRAZOLE 20 MG PO CPDR: 20.0000 mg | DELAYED_RELEASE_CAPSULE | Freq: Three times a day (TID) | ORAL | 0 refills | Status: DC

## 2024-09-07 MED ORDER — LISINOPRIL 30 MG PO TABS: 30.0000 mg | ORAL_TABLET | Freq: Every morning | ORAL | 5 refills | Status: AC

## 2024-09-07 NOTE — Telephone Encounter (Unsigned)
 Copied from CRM [id number]. Topic: Clinical - Medication Refill >> Sep 07, 2024  8:54 AM Carlyon D wrote: Medication:  omeprazole  (PRILOSEC) 20 MG capsule  fluticasone  (FLONASE ) 50 MCG/ACT nasal spray   lisinopril  (ZESTRIL ) 30 MG tablet   empagliflozin  (JARDIANCE ) 10 MG TABS tablet    Has the patient contacted their pharmacy? Yes (Agent: If no, request that the patient contact the pharmacy for the refill. If patient does not wish to contact the pharmacy document the reason why and proceed with request.) (Agent: If yes, when and what did the pharmacy advise?)  This is the patient's preferred pharmacy:  [phone], [place] - [address] [place] [address] Phone: [phone] Fax: [phone]  Is this the correct pharmacy for this prescription? Yes If no, delete pharmacy and type the correct one.   Has the prescription been filled recently? No  Is the patient out of the medication? Yes  Has the patient been seen for an appointment in the last year OR does the patient have an upcoming appointment? No  Can we respond through MyChart? Yes  Agent: Please be advised that Rx refills may take up to 3 business days. We ask that you follow-up with your pharmacy.

## 2024-09-07 NOTE — Telephone Encounter (Signed)
 FYI Only or Action Required?: FYI only for provider.  Patient was last seen in primary care on 05/03/2024 by Lucius Krabbe, NP.  Called Nurse Triage reporting Back Pain.  Symptoms began chronic.  Interventions attempted: Prescription medications: hydrocodone .  Symptoms are: back pain gradually worsening.  Triage Disposition: See HCP Within 4 Hours (Or PCP Triage)  Patient/caregiver understands and will follow disposition?: Yes           Copied from CRM [id number]. Topic: Clinical - Red Word Triage >> Sep 07, 2024  9:02 AM Carlyon D wrote: Red Word that prompted transfer to Nurse Triage: Stomach problems, back pain bulging discs pt needs her meds and is crying she is very upset Reason for Disposition  [1] SEVERE back pain (e.g., excruciating, unable to do any normal activities) AND [2] not improved 2 hours after pain medicine  Answer Assessment - Initial Assessment Questions 1. ONSET: When did the pain begin? (e.g., minutes, hours, days)     Chronic back pain, she states she has herniated and bulging discs.  2. LOCATION: Where does it hurt? (upper, mid or lower back)     She states her cervical spine and lumbar. (Upper and lower).  3. SEVERITY: How bad is the pain?  (e.g., Scale 1-10; mild, moderate, or severe)     10 and beyond/10.  4. PATTERN: Is the pain constant? (e.g., yes, no; constant, intermittent)      Constant, worse in the morning when she wakes up.  5. RADIATION: Does the pain shoot into your legs or somewhere else?     Radiates down both legs it's been doing that for a long time.  6. CAUSE:  What do you think is causing the back pain?      She states she has lost weight and has a lot of extra skin weighing down and she thinks that is putting some stress on her back. She also states with her age she think that may be affecting her back pain.  7. BACK OVERUSE:  Any recent lifting of heavy objects, strenuous work or exercise?     No.  8.  MEDICINES: What have you taken so far for the pain? (e.g., nothing, acetaminophen , NSAIDS)     She states urgent care gave her hydrocodone .  9. NEUROLOGIC SYMPTOMS: Do you have any weakness, numbness, or problems with bowel/bladder control?     No. She states she only has incontinence when laughing or coughing too hard.  10. OTHER SYMPTOMS: Do you have any other symptoms? (e.g., fever, abdomen pain, burning with urination, blood in urine)       Bloating, gas.  11. PREGNANCY: Is there any chance you are pregnant? When was your last menstrual period?       N/A.  Patient upset with her care from PCP and states I don't plan to ever see her. Read off note in chart: Hydrocodone  is a narcotic and requires an office visit for refill. Patient states she is in the process of finding a doctor that will care. Patient declined RN offer to [facility] to a different [place] PCP. She states she is done with [facility].  Protocols used: Back Pain-A-AH

## 2024-09-29 ENCOUNTER — Other Ambulatory Visit: Payer: Self-pay | Admitting: Family

## 2024-10-15 NOTE — Progress Notes (Signed)
 Sara Juarez                                          MRN: [id number]   10/15/2024   The [facility] Quality Team Specialist reviewed this patient medical record for the purposes of chart review for care gap closure. The following were reviewed: chart review for care gap closure-kidney health evaluation for diabetes:eGFR  and uACR. KED is closed in [facility] portal.     VBCI Quality Team

## 2024-10-15 NOTE — Progress Notes (Signed)
 Sara Juarez                                          MRN: [id number]   10/15/2024   The VBCI Quality Team Specialist reviewed this patient medical record for the purposes of chart review for care gap closure. The following were reviewed: chart review for care gap closure-glycemic status assessment.    VBCI Quality Team

## 2024-10-25 ENCOUNTER — Other Ambulatory Visit: Payer: Self-pay | Admitting: Family

## 2024-11-11 ENCOUNTER — Ambulatory Visit (HOSPITAL_COMMUNITY)
Admission: EM | Admit: 2024-11-11 | Discharge: 2024-11-11 | Disposition: A | Discharge status: Home / Self Care | Attending: Internal Medicine | Admitting: Internal Medicine

## 2024-11-11 ENCOUNTER — Encounter (HOSPITAL_COMMUNITY): Payer: Self-pay | Admitting: Emergency Medicine

## 2024-11-11 DIAGNOSIS — H66001 Acute suppurative otitis media without spontaneous rupture of ear drum, right ear: Secondary | ICD-10-CM | POA: Diagnosis not present

## 2024-11-11 DIAGNOSIS — R040 Epistaxis: Secondary | ICD-10-CM

## 2024-11-11 MED ORDER — KETOROLAC TROMETHAMINE 30 MG/ML IJ SOLN
INTRAMUSCULAR | Status: AC
  Filled 2024-11-11: qty 1

## 2024-11-11 MED ORDER — KETOROLAC TROMETHAMINE 30 MG/ML IJ SOLN
30.0000 mg | Freq: Once | INTRAMUSCULAR | Status: AC
  Administered 2024-11-11: 30 mg via INTRAMUSCULAR

## 2024-11-11 MED ORDER — AMOXICILLIN-POT CLAVULANATE 875-125 MG PO TABS: 1.0000 | ORAL_TABLET | Freq: Two times a day (BID) | ORAL | 0 refills | Status: AC

## 2024-11-11 MED ORDER — KETOROLAC TROMETHAMINE 30 MG/ML IJ SOLN: 30.0000 mg | Freq: Once | INTRAMUSCULAR | Status: DC

## 2024-11-11 NOTE — ED Provider Notes (Signed)
 [facility]    CSN: [id number] Arrival date & time: 11/11/24  1256      History   Chief Complaint Chief Complaint  Patient presents with   Epistaxis   Headache    HPI Sara Juarez is a 63 y.o. female.   Sara Juarez is a 63 y.o. female presenting for chief complaint of nosebleeding and right sided headache with nasal congestion and right ear pain that started today upon waking. She reports decreased hearing from the right ear and right sided headache.  History of chronic sinusitis and chronic allergic rhinitis.  She uses Flonase , saline rinses, and the Nettie pot for chronic sinusitis symptoms.  She blew her nose this morning and saw a large clot of blood in the tissue.  She then experienced slow nasal bleed from the right nostril off-and-on today which would drain down the back of her throat intermittently causing her to have blood-streaked phlegm with coughing today.  Reports cold chills today without documented fever at home.  Nose is no longer bleeding and she denies dizziness, nausea, vomiting, vision disturbance,  shortness of breath, and chest discomfort.  Denies sick contacts with similar symptoms.  She does not take blood thinners.   Epistaxis Associated symptoms: headaches   Headache   Past Medical History:  Diagnosis Date   Anxiety    Asthma    Cervical herniated disc    Chronic pain 04/15/2017   Constipation 04/15/2017   Diabetes mellitus without complication (HCC)    Glaucoma (increased eye pressure)    HTN (hypertension) 02/21/2017   Pyelonephritis 02/21/2017    Patient Active Problem List   Diagnosis Date Noted   Chronic post-traumatic stress disorder (PTSD) 02/09/2024   Chronic allergic rhinitis 01/18/2024   Eczema 01/18/2024   Depression, recurrent 01/18/2024   Polycythemia vera (HCC) 01/18/2024   Polysubstance abuse (HCC) 09/22/2023   DM2 (diabetes mellitus, type 2) (HCC) 02/21/2017   Hypertension associated with type 2  diabetes mellitus (HCC) 02/21/2017    Past Surgical History:  Procedure Laterality Date   BILATERAL CARPAL TUNNEL RELEASE     BLADDER SUSPENSION     NASAL SINUS SURGERY     PARTIAL HYSTERECTOMY      OB History   No obstetric history on file.      Home Medications    Prior to Admission medications   Medication Sig Start Date End Date Taking? Authorizing Provider  amoxicillin -clavulanate (AUGMENTIN ) 875-125 MG tablet Take 1 tablet by mouth every 12 (twelve) hours. 11/11/24  Yes Enedelia Dorna HERO, FNP  aspirin EC 81 MG tablet Take 325 mg by mouth daily. 01/27/21   [provider]  Continuous Glucose Receiver (DEXCOM G7 RECEIVER) DEVI Please provide 1 dexcom g7 receiver 03/01/24   Lucius Krabbe, NP  Continuous Glucose Sensor (DEXCOM G7 SENSOR) MISC Apply sensor every 10 days. Pharmacy please provide 3 boxes (1 sensor per week) 01/18/24   Lucius Krabbe, NP  cyclobenzaprine  (FLEXERIL ) 10 MG tablet Take 10 mg by mouth 2 (two) times daily as needed. 09/28/23   [provider]  cycloSPORINE (RESTASIS) 0.05 % ophthalmic emulsion Apply to eye. 01/26/18   [provider]  empagliflozin  (JARDIANCE ) 10 MG TABS tablet Take 1 tablet (10 mg total) by mouth daily. 09/07/24   Lucius Krabbe, NP  fluconazole  (DIFLUCAN ) 150 MG tablet Take 1 pill today and the 2nd pill in 3 days. 07/31/24   Lucius Krabbe, NP  fluticasone  (FLONASE ) 50 MCG/ACT nasal spray Place 1 spray into both  nostrils 2 (two) times daily. 09/07/24   Lucius Krabbe, NP  HYDROcodone -acetaminophen  (NORCO/VICODIN) 5-325 MG tablet Take 1 tablet by mouth every 4 (four) hours as needed. 06/20/24 06/20/25  Flint Sonny POUR, PA-C  Insulin  Lispro Prot & Lispro (HUMALOG 75/25 MIX) (75-25) 100 UNIT/ML Kwikpen Inject into the skin as needed (When CBG elevated). 01/27/21   [provider]  lisinopril  (ZESTRIL ) 30 MG tablet Take 1 tablet (30 mg total) by mouth every morning. 09/07/24   Hudnell, Krabbe, NP   LUMIGAN 0.01 % SOLN Place 1 drop into both eyes every evening. 06/04/16   [provider]  MOUNJARO  5 MG/0.5ML Pen INJECT 5 MG SUBCUTANEOUSLY WEEKLY 10/26/24   Hudnell, Krabbe, NP  omeprazole  (PRILOSEC) 20 MG capsule TAKE 1 CAPSULE (20 MG TOTAL) BY MOUTH 3 (THREE) TIMES DAILY. 10/01/24   Lucius Krabbe, NP  ONETOUCH ULTRA test strip USE TO TEST BLOOD SUGAR TWICE A DAY 05/11/24   Lucius Krabbe, NP  traZODone (DESYREL) 100 MG tablet Take 100 mg by mouth at bedtime. 05/02/24   [provider]  triamcinolone  cream (KENALOG ) 0.1 % APPLY 1 APPLICATION TOPICALLY DAILY AS NEEDED (ON AFFECTED AREAS(S) ON SKIN). 02/28/24   Lucius Krabbe, NP  insulin  aspart protamine - aspart (NOVOLOG  70/30 MIX) (70-30) 100 UNIT/ML FlexPen Take 35 units with breakfast and take 30 units with supper everyday. Patient not taking: Reported on 05/18/2017 04/17/17 02/19/20  Vicci Afton CROME, MD  metFORMIN  (GLUCOPHAGE  XR) 500 MG 24 hr tablet Take 2 tabs with breakfast and 2 tabs with supper everyday Patient not taking: Reported on 04/10/2019 04/17/17 02/19/20  Vicci Afton CROME, MD    Family History Family History  Problem Relation Age of Onset   Hypertension Mother    Diabetes Mother    Hypertension Father    Diabetes Father    Colon cancer Father     Social History Social History   Tobacco Use   Smoking status: Every Day    Current packs/day: 0.75    Types: Cigarettes   Smokeless tobacco: Never  Vaping Use   Vaping status: Never Used  Substance Use Topics   Alcohol use: Yes    Alcohol/week: 2.0 standard drinks of alcohol    Types: 2 Shots of liquor per week    Comment: shots    Drug use: Yes    Types: Cocaine, Marijuana    Comment: Past Cocaine, current Marijuana     Allergies   Erythromycin and Aspirin   Review of Systems Review of Systems  HENT:  Positive for nosebleeds.   Neurological:  Positive for headaches.  Per HPI   Physical Exam Triage Vital Signs ED  Triage Vitals  Encounter Vitals Group     BP 11/11/24 1259 (!) 147/81     Girls Systolic BP Percentile --      Girls Diastolic BP Percentile --      Boys Systolic BP Percentile --      Boys Diastolic BP Percentile --      Pulse Rate 11/11/24 1259 98     Resp 11/11/24 1259 19     Temp 11/11/24 1259 98.2 F (36.8 C)     Temp Source 11/11/24 1259 Oral     SpO2 11/11/24 1259 96 %     Weight 11/11/24 1703 168 lb (76.2 kg)     Height 11/11/24 1703 5' 4 (1.626 m)     Head Circumference --      Peak Flow --      Pain Score  11/11/24 1258 10     Pain Loc --      Pain Education --      Exclude from Growth Chart --    No data found.  Updated Vital Signs BP (!) 147/81 (BP Location: Right Arm)   Pulse 98   Temp 98.2 F (36.8 C) (Oral)   Resp 19   Ht 5' 4 (1.626 m)   Wt 168 lb (76.2 kg)   SpO2 96%   BMI 28.84 kg/m   Visual Acuity Right Eye Distance:   Left Eye Distance:   Bilateral Distance:    Right Eye Near:   Left Eye Near:    Bilateral Near:     Physical Exam Vitals and nursing note reviewed.  Constitutional:      Appearance: She is not ill-appearing or toxic-appearing.  HENT:     Head: Normocephalic and atraumatic.     Right Ear: Hearing and external ear normal. Swelling and tenderness present. No middle ear effusion. There is no impacted cerumen. Tympanic membrane is erythematous and bulging. Tympanic membrane is not perforated.     Left Ear: Hearing, tympanic membrane, ear canal and external ear normal.     Nose: Nose normal.     Right Nostril: No epistaxis.     Left Nostril: No epistaxis.     Comments: No current active epistaxis to the bilateral turbinates.  There is evidence of mucosal irritation/erythema to the right anterior turbinate.    Mouth/Throat:     Lips: Pink.     Mouth: Mucous membranes are moist. No injury or oral lesions.     Dentition: Normal dentition.     Tongue: No lesions.     Pharynx: Oropharynx is clear. Uvula midline. No pharyngeal  swelling, oropharyngeal exudate, posterior oropharyngeal erythema, uvula swelling or postnasal drip.     Tonsils: No tonsillar exudate.     Comments: No blood to the posterior oropharynx. Eyes:     General: Lids are normal. Vision grossly intact. Gaze aligned appropriately.     Extraocular Movements: Extraocular movements intact.     Conjunctiva/sclera: Conjunctivae normal.  Neck:     Trachea: Trachea and phonation normal.  Cardiovascular:     Rate and Rhythm: Normal rate and regular rhythm.     Heart sounds: Normal heart sounds, S1 normal and S2 normal.  Pulmonary:     Effort: Pulmonary effort is normal. No respiratory distress.     Breath sounds: Normal breath sounds and air entry.  Musculoskeletal:     Cervical back: Neck supple.  Lymphadenopathy:     Cervical: Cervical adenopathy present.  Skin:    General: Skin is warm and dry.     Capillary Refill: Capillary refill takes less than 2 seconds.     Findings: No rash.  Neurological:     General: No focal deficit present.     Mental Status: She is alert and oriented to person, place, and time. Mental status is at baseline.     Cranial Nerves: No dysarthria or facial asymmetry.  Psychiatric:        Mood and Affect: Mood normal.        Speech: Speech normal.        Behavior: Behavior normal.        Thought Content: Thought content normal.        Judgment: Judgment normal.      UC Treatments / Results  Labs (all labs ordered are listed, but only abnormal results are displayed) Labs Reviewed -  No data to display  EKG   Radiology No results found.  Procedures Procedures (including critical care time)  Medications Ordered in UC Medications  ketorolac  (TORADOL ) 30 MG/ML injection 30 mg (30 mg Intramuscular Given 11/11/24 1758)    Initial Impression / Assessment and Plan / UC Course  I have reviewed the triage vital signs and the nursing notes.  Pertinent labs & imaging results that were available during my care of  the patient were reviewed by me and considered in my medical decision making (see chart for details).   1.  Nonrecurrent acute suppurative otitis media of right ear without spontaneous rupture Presentation is consistent with acute bacterial infection of the right ear which is most likely causing patient's right sided headache and pain. Augmentin  twice daily for 7 days ordered due to concern for significant pressure behind the right eardrum which may result in eardrum perforation.  Ketorolac  30 mg IM given for pain and inflammation.  Patient is requesting Vicodin to treat her pain today and for her chronic back pain, however we have discussed clinical indications for narcotic pain medications and we will use ketorolac  instead. History of aspirin allergy, she has tolerated ketorolac  in the past after aspirin allergy was documented. Most recent CMP shows intact renal function.  No active epistaxis. Recommend continued use of Flonase  and saline rinses. Advised to return to urgent care for recurrent nosebleeds should acute brisk bleeding began again.  Follow-up with PCP as needed.  Counseled patient on potential for adverse effects with medications prescribed/recommended today, strict ER and return-to-clinic precautions discussed, patient verbalized understanding.    Final Clinical Impressions(s) / UC Diagnoses   Final diagnoses:  Non-recurrent acute suppurative otitis media of right ear without spontaneous rupture of tympanic membrane  Anterior epistaxis     Discharge Instructions      You have a right ear infection.  This is causing your head pain. Please take Augmentin  every 12 hours for 7 days. We gave you an injection of ketorolac  in the clinic today which is a strong anti-inflammatory medication.  Do not take any NSAID containing medications for the next 24 hours. You may take Tylenol  1000 mg every 6 hours as needed for pain.  Use Flonase  2 puffs into each nostril daily. You may  find relief from sinus rinses as well. If you have further acute nosebleeding, please return to clinic.  Follow-up with your primary care provider.    ED Prescriptions     Medication Sig Dispense Auth. Provider   amoxicillin -clavulanate (AUGMENTIN ) 875-125 MG tablet Take 1 tablet by mouth every 12 (twelve) hours. 14 tablet Enedelia Dorna HERO, FNP      I have reviewed the PDMP during this encounter.   Enedelia Dorna HERO, [place] 11/11/24 1857

## 2024-11-11 NOTE — Discharge Instructions (Addendum)
 You have a right ear infection.  This is causing your head pain. Please take Augmentin  every 12 hours for 7 days. We gave you an injection of ketorolac  in the clinic today which is a strong anti-inflammatory medication.  Do not take any NSAID containing medications for the next 24 hours. You may take Tylenol  1000 mg every 6 hours as needed for pain.  Use Flonase  2 puffs into each nostril daily. You may find relief from sinus rinses as well. If you have further acute nosebleeding, please return to clinic.  Follow-up with your primary care provider.

## 2024-11-11 NOTE — ED Notes (Addendum)
 Pt refused IM injection of ketorolac  in ventrogluteal region. Pt requested IM injection in RT Quadricepts . Pt verbalizes understanding that the Im in Quadricepts may cause pain in her leg.

## 2024-11-11 NOTE — ED Triage Notes (Signed)
 Pt reports last night I felt crazy pt c/o right side of head and right ear hurting since woke up. Reports while ago when blew nose blew out large blood clot then had large clot in her throat. Takes aspirin.

## 2024-11-11 NOTE — ED Notes (Signed)
 Per Hosey, NP pt okay to wait in lobby for available room come open.

## 2024-12-03 ENCOUNTER — Ambulatory Visit (HOSPITAL_COMMUNITY): Admission: EM | Admit: 2024-12-03 | Discharge: 2024-12-03 | Discharge status: Against Medical Advice

## 2024-12-03 NOTE — ED Notes (Signed)
 Pt called from waiting area. Per Patient Access, pt stated she was going to return at another time due to wait.

## 2024-12-16 ENCOUNTER — Emergency Department (HOSPITAL_COMMUNITY)

## 2024-12-16 ENCOUNTER — Other Ambulatory Visit: Payer: Self-pay

## 2024-12-16 ENCOUNTER — Encounter (HOSPITAL_COMMUNITY): Payer: Self-pay

## 2024-12-16 ENCOUNTER — Emergency Department (HOSPITAL_COMMUNITY)
Admission: EM | Admit: 2024-12-16 | Discharge: 2024-12-16 | Disposition: A | Discharge status: Home / Self Care | Attending: Emergency Medicine | Admitting: Emergency Medicine

## 2024-12-16 DIAGNOSIS — J181 Lobar pneumonia, unspecified organism: Secondary | ICD-10-CM | POA: Diagnosis not present

## 2024-12-16 DIAGNOSIS — J45909 Unspecified asthma, uncomplicated: Secondary | ICD-10-CM | POA: Diagnosis not present

## 2024-12-16 DIAGNOSIS — E119 Type 2 diabetes mellitus without complications: Secondary | ICD-10-CM | POA: Diagnosis not present

## 2024-12-16 DIAGNOSIS — Z794 Long term (current) use of insulin: Secondary | ICD-10-CM | POA: Insufficient documentation

## 2024-12-16 DIAGNOSIS — I10 Essential (primary) hypertension: Secondary | ICD-10-CM | POA: Diagnosis not present

## 2024-12-16 DIAGNOSIS — Z7982 Long term (current) use of aspirin: Secondary | ICD-10-CM | POA: Insufficient documentation

## 2024-12-16 DIAGNOSIS — J189 Pneumonia, unspecified organism: Secondary | ICD-10-CM

## 2024-12-16 DIAGNOSIS — R059 Cough, unspecified: Secondary | ICD-10-CM | POA: Diagnosis present

## 2024-12-16 LAB — BASIC METABOLIC PANEL WITH GFR
Anion gap: 11 (ref 5–15)
BUN: 12 mg/dL (ref 8–23)
CO2: 25 mmol/L (ref 22–32)
Calcium: 9.6 mg/dL (ref 8.9–10.3)
Chloride: 101 mmol/L (ref 98–111)
Creatinine, Ser: 0.69 mg/dL (ref 0.44–1.00)
GFR, Estimated: >60 mL/min
Glucose, Bld: 130 mg/dL — ABNORMAL HIGH (ref 70–99)
Potassium: 4 mmol/L (ref 3.5–5.1)
Sodium: 137 mmol/L (ref 135–145)

## 2024-12-16 LAB — CBC WITH DIFFERENTIAL/PLATELET
Abs Immature Granulocytes: 0.03 K/uL (ref 0.00–0.07)
Basophils Absolute: 0.1 K/uL (ref 0.0–0.1)
Basophils Relative: 1 %
Eosinophils Absolute: 0.2 K/uL (ref 0.0–0.5)
Eosinophils Relative: 2 %
HCT: 51.9 % — ABNORMAL HIGH (ref 36.0–46.0)
Hemoglobin: 16.7 g/dL — ABNORMAL HIGH (ref 12.0–15.0)
Immature Granulocytes: 0 %
Lymphocytes Relative: 29 %
Lymphs Abs: 2.1 K/uL (ref 0.7–4.0)
MCH: 28.8 pg (ref 26.0–34.0)
MCHC: 32.2 g/dL (ref 30.0–36.0)
MCV: 89.6 fL (ref 80.0–100.0)
Monocytes Absolute: 0.9 K/uL (ref 0.1–1.0)
Monocytes Relative: 12 %
Neutro Abs: 4 K/uL (ref 1.7–7.7)
Neutrophils Relative %: 56 %
Platelets: 408 K/uL — ABNORMAL HIGH (ref 150–400)
RBC: 5.79 MIL/uL — ABNORMAL HIGH (ref 3.87–5.11)
RDW: 13.2 % (ref 11.5–15.5)
WBC: 7.3 K/uL (ref 4.0–10.5)
nRBC: 0 % (ref 0.0–0.2)

## 2024-12-16 MED ORDER — PREDNISONE 20 MG PO TABS: ORAL_TABLET | ORAL | 0 refills | Status: AC

## 2024-12-16 MED ORDER — FLUCONAZOLE 150 MG PO TABS: 150.0000 mg | ORAL_TABLET | Freq: Once | ORAL | 0 refills | Status: AC

## 2024-12-16 MED ORDER — DOXYCYCLINE HYCLATE 100 MG PO CAPS: 100.0000 mg | ORAL_CAPSULE | Freq: Two times a day (BID) | ORAL | 0 refills | Status: AC

## 2024-12-16 NOTE — Discharge Instructions (Signed)
 Take the antibiotics as discussed.  Make sure you finish the whole course.  Use your albuterol  inhaler 2 puffs every 4-6 hours for the next 3 to 4 days and then as needed.  Take the course of prednisone  as discussed.  Continue using your Flomax and Mucinex.  Follow-up with your ENT as scheduled.  Have close follow-up with your primary care doctor to make sure your pneumonia has improved.  Return to the emergency room if you have any worsening symptoms.

## 2024-12-16 NOTE — ED Notes (Signed)
 NT alerted Sort RN of Pt's O2 sat while getting the vitals. Pt was offered oxygen by nurse, and Pt refused the oxygen, stating she wanted to go to the back, not the oxygen.

## 2024-12-16 NOTE — ED Provider Notes (Signed)
 " [name] Provider Note   CSN: [id number] Arrival date & time: 12/16/24  9063     Patient presents with: Cough   Sara Juarez is a 63 y.o. female.   Patient is a 63 year old who presents with cough and nasal congestion.  She said the symptoms started about 64 weeks ago.  She has a history of nasal polyps and prior sinus infections.  She has had a lot of drainage from her sinuses as well as pressure in her ears.  She also has a cough which is productive of some yellow-green sputum.  She denies any known fevers.  She says sometimes she gets short of breath.  She has a history of either asthma or COPD.  She does have an albuterol  inhaler at home that she uses periodically.  She is not on home oxygen.  Her oxygen saturations in triage were 90-91.  She says her oxygenation normally runs about 94%.  She denies any associated chest pain.  No vomiting.  She has an appointment to follow-up with her ear nose and throat doctor on January 9.  She was treated for an ear infection with Augmentin  at the end of Sara.       Prior to Admission medications  Medication Sig Start Date End Date Taking? Authorizing Provider  doxycycline  (VIBRAMYCIN ) 100 MG capsule Take 1 capsule (100 mg total) by mouth 2 (two) times daily. 12/16/24  Yes Lenor Hollering, MD  fluconazole  (DIFLUCAN ) 150 MG tablet Take 1 tablet (150 mg total) by mouth once for 1 dose. 12/16/24 12/16/24 Yes Lenor Hollering, MD  predniSONE  (DELTASONE ) 20 MG tablet 3 tabs po day one, then 2 po daily x 4 days 12/16/24  Yes Lenor Hollering, MD  amoxicillin -clavulanate (AUGMENTIN ) 875-125 MG tablet Take 1 tablet by mouth every 12 (twelve) hours. 11/11/24   Enedelia Dorna HERO, FNP  aspirin EC 81 MG tablet Take 325 mg by mouth daily. 01/27/21   [provider]  Continuous Glucose Receiver (DEXCOM G7 RECEIVER) DEVI Please provide 1 dexcom g7 receiver 03/01/24   Lucius Krabbe, NP   Continuous Glucose Sensor (DEXCOM G7 SENSOR) MISC Apply sensor every 10 days. Pharmacy please provide 3 boxes (1 sensor per week) 01/18/24   Lucius Krabbe, NP  cyclobenzaprine  (FLEXERIL ) 10 MG tablet Take 10 mg by mouth 2 (two) times daily as needed. 09/28/23   [provider]  cycloSPORINE (RESTASIS) 0.05 % ophthalmic emulsion Apply to eye. 01/26/18   [provider]  empagliflozin  (JARDIANCE ) 10 MG TABS tablet Take 1 tablet (10 mg total) by mouth daily. 09/07/24   Lucius Krabbe, NP  fluticasone  (FLONASE ) 50 MCG/ACT nasal spray Place 1 spray into both nostrils 2 (two) times daily. 09/07/24   Lucius Krabbe, NP  HYDROcodone -acetaminophen  (NORCO/VICODIN) 5-325 MG tablet Take 1 tablet by mouth every 4 (four) hours as needed. 06/20/24 06/20/25  Flint Sonny POUR, PA-C  Insulin  Lispro Prot & Lispro (HUMALOG 75/25 MIX) (75-25) 100 UNIT/ML Kwikpen Inject into the skin as needed (When CBG elevated). 01/27/21   [provider]  lisinopril  (ZESTRIL ) 30 MG tablet Take 1 tablet (30 mg total) by mouth every morning. 09/07/24   Hudnell, Krabbe, NP  LUMIGAN 0.01 % SOLN Place 1 drop into both eyes every evening. 06/04/16   [provider]  MOUNJARO  5 MG/0.5ML Pen INJECT 5 MG SUBCUTANEOUSLY WEEKLY 10/26/24   Hudnell, Krabbe, NP  omeprazole  (PRILOSEC) 20 MG capsule TAKE 1 CAPSULE (20 MG TOTAL) BY MOUTH 3 (THREE)  TIMES DAILY. 10/01/24   Lucius Krabbe, NP  ONETOUCH ULTRA test strip USE TO TEST BLOOD SUGAR TWICE A DAY 05/11/24   Lucius Krabbe, NP  traZODone (DESYREL) 100 MG tablet Take 100 mg by mouth at bedtime. 05/02/24   [provider]  triamcinolone  cream (KENALOG ) 0.1 % APPLY 1 APPLICATION TOPICALLY DAILY AS NEEDED (ON AFFECTED AREAS(S) ON SKIN). 02/28/24   Lucius Krabbe, NP  insulin  aspart protamine - aspart (NOVOLOG  70/30 MIX) (70-30) 100 UNIT/ML FlexPen Take 35 units with breakfast and take 30 units with supper everyday. Patient not taking: Reported  on 05/18/2017 04/17/17 02/19/20  Vicci Afton CROME, MD  metFORMIN  (GLUCOPHAGE  XR) 500 MG 24 hr tablet Take 2 tabs with breakfast and 2 tabs with supper everyday Patient not taking: Reported on 04/10/2019 04/17/17 02/19/20  Vicci Afton CROME, MD    Allergies: Erythromycin and Aspirin    Review of Systems  Constitutional:  Positive for fatigue. Negative for chills, diaphoresis and fever.  HENT:  Positive for congestion and rhinorrhea. Negative for sneezing.   Eyes: Negative.   Respiratory:  Positive for cough and shortness of breath. Negative for chest tightness.   Cardiovascular:  Negative for chest pain and leg swelling.  Gastrointestinal:  Negative for abdominal pain, diarrhea, nausea and vomiting.  Genitourinary:  Negative for difficulty urinating, flank pain and frequency.  Musculoskeletal:  Negative for arthralgias and back pain.  Skin:  Negative for rash.  Neurological:  Negative for dizziness, speech difficulty, weakness, numbness and headaches.    Updated Vital Signs BP (!) 169/88 (BP Location: Left Arm)   Pulse 96   Temp 99 F (37.2 C) (Oral)   Resp 18   Ht 5' 4 (1.626 m)   Wt 76.2 kg   SpO2 92%   BMI 28.84 kg/m   Physical Exam Constitutional:      Appearance: She is well-developed.  HENT:     Head: Normocephalic and atraumatic.     Left Ear: Tympanic membrane normal.     Ears:     Comments: Right TM has some cloudy fluid and bulging of the membrane but no erythema.    Nose: Congestion present.  Eyes:     Pupils: Pupils are equal, round, and reactive to light.  Cardiovascular:     Rate and Rhythm: Normal rate and regular rhythm.     Heart sounds: Normal heart sounds.  Pulmonary:     Effort: Pulmonary effort is normal. No respiratory distress.     Breath sounds: Wheezing (Mild expiratory wheezes) and rhonchi present. No rales.  Chest:     Chest wall: No tenderness.  Abdominal:     General: Bowel sounds are normal.     Palpations: Abdomen is soft.      Tenderness: There is no abdominal tenderness. There is no guarding or rebound.  Musculoskeletal:        General: Normal range of motion.     Cervical back: Normal range of motion and neck supple.     Comments: No edema or calf tenderness  Lymphadenopathy:     Cervical: No cervical adenopathy.  Skin:    General: Skin is warm and dry.     Findings: No rash.  Neurological:     Mental Status: She is alert and oriented to person, place, and time.     (all labs ordered are listed, but only abnormal results are displayed) Labs Reviewed  CBC WITH DIFFERENTIAL/PLATELET - Abnormal; Notable for the following components:      Result  Value   RBC 5.79 (*)    Hemoglobin 16.7 (*)    HCT 51.9 (*)    Platelets 408 (*)    All other components within normal limits  BASIC METABOLIC PANEL WITH GFR - Abnormal; Notable for the following components:   Glucose, Bld 130 (*)    All other components within normal limits  RESP PANEL BY RT-PCR (RSV, FLU A&B, COVID)  RVPGX2  MISC [facility] TEST (SEND OUT)    EKG: None  Radiology: DG Chest 2 View Result Date: 12/16/2024 CLINICAL DATA:  Cough and nasal congestion. EXAM: CHEST - 2 VIEW COMPARISON:  November 28, 2021 FINDINGS: The heart size and mediastinal contours are within normal limits. Mild diffusely increased lung markings are seen which may be chronic in nature. Mild atelectasis and/or infiltrate is seen within the left lung base. A superimposed component of breast attenuation artifact is noted. No pleural effusion or pneumothorax is identified. The visualized skeletal structures are unremarkable. IMPRESSION: Mild left basilar atelectasis and/or infiltrate with a superimposed component of breast attenuation artifact. Electronically Signed   By: Suzen Dials M.D.   On: 12/16/2024 13:22     Procedures   Medications Ordered in the ED - No data to display                                  Medical Decision Making Risk Prescription drug  management.   This patient presents to the ED for concern of sinus congestion, cough, this involves an extensive number of treatment options, and is a complaint that carries with it a high risk of complications and morbidity.  I considered the following differential and admission for this acute, potentially life threatening condition.  The differential diagnosis includes upper respiratory infection, pneumonia, influenza, PE, asthma exacerbation  MDM:    Patient is a 63 year old who presents with drainage from her sinuses as well as coughing.  She is not febrile.  She is overall well-appearing.  On my evaluation her heart rate is in the 90s and her oxygen saturation is 95% on room air.  With ambulation, her oxygen saturation was 96 or 97%.  Her labs are nonconcerning.  Chest x-ray shows a left lower lobe infiltrate.  Her COVID/flu is pending but is currently being a send out lab.  However her symptoms started 2 weeks ago she was she is not in the window for any treatment.  She is overall well-appearing.  Like she can be treated as an outpatient.  Will prescribe her course of doxycycline  as well as a 5-day course of prednisone .  Advised her to use her albuterol  inhaler 2 puffs every 4-6 hours for the next few days and then as needed.  She was encouraged to have close follow-up with her PCP.  Return precautions were given.  She also requested prescription for Diflucan  because she normally gets yeast infection after the antibiotics.  (Labs, imaging, consults)  Labs: I Ordered, and personally interpreted labs.  The pertinent results include: Normal white count, otherwise nonconcerning, COVID/flu pending.  Imaging Studies ordered: I ordered imaging studies including chest x-ray I independently visualized and interpreted imaging. I agree with the radiologist interpretation  Additional history obtained from  .  External records from outside source obtained and reviewed including prior notes  Cardiac  Monitoring: The patient was not maintained on a cardiac monitor.  If on the cardiac monitor, I personally viewed and interpreted the cardiac monitored  which showed an underlying rhythm of:    Reevaluation: After the interventions noted above, I reevaluated the patient and found that they have :improved  Social Determinants of Health:  substance abuse  Disposition: Discharged to home  Co morbidities that complicate the patient evaluation  Past Medical History:  Diagnosis Date   Anxiety    Asthma    Cervical herniated disc    Chronic pain 04/15/2017   Constipation 04/15/2017   Diabetes mellitus without complication (HCC)    Glaucoma (increased eye pressure)    HTN (hypertension) 02/21/2017   Pyelonephritis 02/21/2017     Medicines Meds ordered this encounter  Medications   doxycycline  (VIBRAMYCIN ) 100 MG capsule    Sig: Take 1 capsule (100 mg total) by mouth 2 (two) times daily.    Dispense:  20 capsule    Refill:  0   fluconazole  (DIFLUCAN ) 150 MG tablet    Sig: Take 1 tablet (150 mg total) by mouth once for 1 dose.    Dispense:  1 tablet    Refill:  0   predniSONE  (DELTASONE ) 20 MG tablet    Sig: 3 tabs po day one, then 2 po daily x 4 days    Dispense:  11 tablet    Refill:  0    I have reviewed the patients home medicines and have made adjustments as needed  Problem List / ED Course: Problem List Items Addressed This Visit   None Visit Diagnoses       Community acquired pneumonia of left lower lobe of lung    -  Primary   Relevant Medications   doxycycline  (VIBRAMYCIN ) 100 MG capsule   fluconazole  (DIFLUCAN ) 150 MG tablet                Final diagnoses:  Community acquired pneumonia of left lower lobe of lung    ED Discharge Orders          Ordered    doxycycline  (VIBRAMYCIN ) 100 MG capsule  2 times daily        12/16/24 1749    fluconazole  (DIFLUCAN ) 150 MG tablet   Once        12/16/24 1749    predniSONE  (DELTASONE ) 20 MG tablet         12/16/24 1749               Lenor Hollering, MD 12/16/24 1756  "

## 2024-12-16 NOTE — ED Triage Notes (Signed)
 Patient reports nasal congestion with blood and pus and cough with brown sputum for two weeks.  Patient is hoarse eyes are blood shot.  Patient tearful in triage.  Reports been to UC with no helpl

## 2024-12-16 NOTE — ED Notes (Signed)
 Pt sitting in yellow section of lobby, seen coughing without a mask on, pt here for a cough and has a resp panel results pending. Pt moved to respiratory section of the lobby. After we moved the pt she became upset and asked why she was moved to the section with all the sick people. This Rn explained why and pt stated when am I gonna be called back this RN explained that there are 10-12 people ahead of her that it is unsure when she will be called. Pt stated she was going to go wait in her truck.

## 2024-12-16 NOTE — ED Provider Triage Note (Signed)
 Emergency Medicine Provider Triage Evaluation Note  Sara Juarez , a 63 y.o. female  was evaluated in triage.  Pt complains of 2 weeks of cough/congestion.  Patient endorses 2 weeks of a productive cough, history of COPD.  Patient was treated with a 5-day course of amoxicillin  for an inner ear infection, she completed these antibiotics but has not had any improvement in her symptoms.  Review of Systems  Positive: As above Negative: As above  Physical Exam  BP (!) 148/80   Pulse 100   Temp 98.3 F (36.8 C)   Resp 18   Ht 5' 4 (1.626 m)   Wt 76.2 kg   SpO2 90%   BMI 28.84 kg/m  Gen:   Awake, no distress   Resp:  Normal effort  MSK:   Moves extremities without difficulty  Other:  Frequent productive cough  Medical Decision Making  Medically screening exam initiated at 11:31 AM.  Appropriate orders placed.  Sanaa A Burson was informed that the remainder of the evaluation will be completed by another provider, this initial triage assessment does not replace that evaluation, and the importance of remaining in the ED until their evaluation is complete.     Glendia Rocky SAILOR, [place] 12/16/24 1132

## 2024-12-17 LAB — MISC LABCORP TEST (SEND OUT): Labcorp test code: 140140

## 2024-12-18 ENCOUNTER — Telehealth: Payer: Self-pay | Admitting: Family

## 2024-12-18 ENCOUNTER — Telehealth: Payer: Self-pay

## 2024-12-18 NOTE — Telephone Encounter (Signed)
 Patient is concerned about pain management.  I have sent phone note over to PCP.   Patient does not wish to schedule ED follow up at this time.

## 2024-12-18 NOTE — Telephone Encounter (Signed)
 Transition Care Management Follow-up Telephone Call Date of discharge and from where: 12/16/24 [facility] How have you been since you were released from the hospital? Same Any questions or concerns? No  Items Reviewed: Did the pt receive and understand the discharge instructions provided? No Received but not Read Medications obtained and verified? No  Other? NO Any new allergies since your discharge? No  Dietary orders reviewed? No Do you have support at home? No   Home Care and Equipment/Supplies: Were home health services ordered? not applicable If so, what is the name of the agency?   Has the agency set up a time to come to the patient's home? not applicable Were any new equipment or medical supplies ordered?  No What is the name of the medical supply agency?  Were you able to get the supplies/equipment? not applicable Do you have any questions related to the use of the equipment or supplies? No  Functional Questionnaire: (I = Independent and D = Dependent) ADLs: I  Bathing/Dressing- I  Meal Prep- I  Eating- I  Maintaining continence- I  Transferring/Ambulation- I  Managing Meds- I  Follow up appointments reviewed:  PCP Hospital f/u appt confirmed? No  Pt refused to schedule ED f/u with PCP Specialist Hospital f/u appt confirmed? Yes  Scheduled to see Burnie Lamar Mormon  on 12/28/24  Are transportation arrangements needed? No  If their condition worsens, is the pt aware to call PCP or go to the Emergency Dept.? Yes Was the patient provided with contact information for the PCP's office or ED? Yes Was to pt encouraged to call back with questions or concerns? Yes   Patient voiced concerns during call stating she felt no need to schedule an appt with PCP due to being ignored and feeling as if PCP showed no concern for her or her needs. Pt states she is in extreme pain and stays in pain not just recent pneumonia related and would like to discuss changing  providers and her concerns with office manager directly.

## 2024-12-18 NOTE — Telephone Encounter (Signed)
 Patient states she is currently in pain.    States refills on oxycodone  have been denied.  Would patient be eligible for pain management referral?

## 2024-12-19 NOTE — Telephone Encounter (Signed)
 I reached out to patient, unable to leave voicemail due to voicemail box being full.

## 2024-12-19 NOTE — Telephone Encounter (Signed)
 yes pain management referral fine by me. need to ask where her pain is. also looks like she has seen another primary provider back in August, please confirm and remove my name as PCP, thanks.

## 2024-12-21 ENCOUNTER — Other Ambulatory Visit: Payer: Self-pay | Admitting: Family

## 2024-12-21 DIAGNOSIS — Z794 Long term (current) use of insulin: Secondary | ICD-10-CM

## 2024-12-21 DIAGNOSIS — I152 Hypertension secondary to endocrine disorders: Secondary | ICD-10-CM

## 2024-12-21 NOTE — Telephone Encounter (Signed)
 I was unable to reach patient again and also unable to leave voicemail due to voicemail box being full. Will try again at a later time.

## 2025-01-03 ENCOUNTER — Telehealth: Payer: Self-pay | Admitting: Family

## 2025-01-03 ENCOUNTER — Telehealth: Payer: Self-pay

## 2025-01-03 NOTE — Telephone Encounter (Signed)
 Copied from CRM [id number]. Topic: General - Other >> Jan 03, 2025 11:51 AM Alfonso HERO wrote: Reason for CRM: patient calling back to provide email address for the needed letter to be sent to her apartments... [email or web address]  Previous message sent to PCP.

## 2025-01-03 NOTE — Telephone Encounter (Signed)
 Copied from CRM #[id number]. Topic: General - Other >> Jan 03, 2025 11:42 AM Amber H wrote: Reason for CRM: Patient stated she is needing some type of documentation showing that she has spinal damage and that need she needs her apartment switched to downstairs instead of upstairs. She stated living upstairs has caused her some issues. I asked if she had the fax number for the apartment complex. Stated she would call back with that info.   [phone]

## 2025-01-04 NOTE — Telephone Encounter (Signed)
 needs OV

## 2025-01-07 ENCOUNTER — Telehealth: Payer: Self-pay | Admitting: Family

## 2025-01-07 ENCOUNTER — Encounter: Payer: Self-pay | Admitting: Family

## 2025-01-07 NOTE — Telephone Encounter (Signed)
 Unable to LVM due to vm is full. Sending MyChart Msg

## 2025-01-07 NOTE — Telephone Encounter (Signed)
 Can you please discontinue me as her PCP, it has me associated with [facility]? Thanks!

## 2025-01-07 NOTE — Telephone Encounter (Signed)
 I have corresponded with patient several times today following up on patient request and concerns.    Patient originally requested a letter stating she had spinal pain and was unable to climb stairs at her apartment.    Patient was upset she had to make an appointment for a follow up.    I also have informed patient that Hud[facility]'s CMA has tried to reach out to her regarding referral to pain management.  As we needed more information for the referral.   Patient was seen in office back in January 2025.  She was later seen virtually back in May 2025.  Patient was to follow back up in one week but did not.    Patient wanted to know if she could be seen today (01/07/2025).  I informed her that Hudnell had an opening at 4pm.  She said she did not want that time as it was too close to our office closing.  I also offered her an appointment for tomorrow morning.  Patient declined.   Patient stated she did not like the care she was receiving from [facility] because she was not an MD.  I offered to get patient connected with the [facility] Scheduling team in order to find an MD taking patients as soon as possible for transfer.  Patient refused.  Patient wanted to know if our office had MD's at our location.  I informed her we did.  However, they did not have soon availability for transfer.     Patient wanted to know if we had records from [facility].  I asked patient if she completed a release.  She stated she did but could not provide me with any time range as to when the release was completed or dates of service.   I looked back two years in media.  I was unable to located any records .  I also looked back 5 years in imaging and could not find any records form [facility].   When I reached back out to the patient she stated she found records from [facility] spanning from 2010 to 2015.  States she no longer needed a letter from  [facility] and stated she would never be back to see her again.

## 2025-01-08 ENCOUNTER — Other Ambulatory Visit: Payer: Self-pay | Admitting: Family

## 2025-01-10 ENCOUNTER — Encounter: Payer: Self-pay | Admitting: Family

## 2025-01-24 ENCOUNTER — Telehealth: Payer: Self-pay | Admitting: Family

## 2025-01-24 NOTE — Telephone Encounter (Signed)
 This letter confirms your request on 01/07/2025 to remove Sara Comment as your Primary Care Provider (PCP). The change has been processed.  Sara Juarez will no longer be able to provide medical care to you.  We believe that our patient/provider relationship has been compromised and thus would request that you seek care elsewhere. 01/10/25

## 2025-05-02 ENCOUNTER — Ambulatory Visit
